# Patient Record
Sex: Male | Born: 1975 | Race: Black or African American | Hispanic: No | Marital: Married
Health system: Midwestern US, Community
[De-identification: ages and names within clinical notes are randomized; demographics above are authoritative.]

## PROBLEM LIST (undated history)

## (undated) ENCOUNTER — Inpatient Hospital Stay: Discharge: 2024-02-02 | Payer: Medicaid (Managed Care)

## (undated) DIAGNOSIS — E119 Type 2 diabetes mellitus without complications: Secondary | ICD-10-CM

## (undated) DIAGNOSIS — R112 Nausea with vomiting, unspecified: Principal | ICD-10-CM

---

## 2010-01-14 LAB — METABOLIC PANEL, BASIC
Anion gap: 1 mmol/L — ABNORMAL LOW (ref 5–15)
BUN/Creatinine ratio: 10 — ABNORMAL LOW (ref 12–20)
BUN: 10 MG/DL (ref 7–18)
CO2: 30 MMOL/L (ref 21–32)
Calcium: 8.1 MG/DL — ABNORMAL LOW (ref 8.4–10.4)
Chloride: 102 MMOL/L (ref 100–108)
Creatinine: 1 MG/DL (ref 0.6–1.3)
GFR est AA: >60 mL/min/{1.73_m2} (ref >60)
GFR est non-AA: >60 mL/min/{1.73_m2} (ref >60)
Glucose: 220 MG/DL — ABNORMAL HIGH (ref 74–99)
Potassium: 3.8 MMOL/L (ref 3.5–5.5)
Sodium: 133 MMOL/L — ABNORMAL LOW (ref 136–145)

## 2010-01-14 LAB — METABOLIC PANEL, COMPREHENSIVE
A-G Ratio: 1 (ref 0.8–1.7)
ALT (SGPT): 65 U/L (ref 30–65)
AST (SGOT): 104 U/L — ABNORMAL HIGH (ref 15–37)
Albumin: 3.6 g/dL (ref 3.4–5.0)
Alk. phosphatase: 87 U/L (ref 50–136)
Anion gap: 1 mmol/L — ABNORMAL LOW (ref 5–15)
BUN/Creatinine ratio: 10 — ABNORMAL LOW (ref 12–20)
BUN: 12 MG/DL (ref 7–18)
Bilirubin, total: 1 MG/DL (ref 0.2–1.0)
CO2: 31 MMOL/L (ref 21–32)
Calcium: 8.9 MG/DL (ref 8.4–10.4)
Chloride: 101 MMOL/L (ref 100–108)
Creatinine: 1.2 MG/DL (ref 0.6–1.3)
GFR est AA: >60 mL/min/{1.73_m2} (ref >60)
GFR est non-AA: >60 mL/min/{1.73_m2} (ref >60)
Globulin: 3.5 g/dL (ref 2.0–4.0)
Glucose: 346 MG/DL — ABNORMAL HIGH (ref 74–99)
Potassium: 4.3 MMOL/L (ref 3.5–5.5)
Protein, total: 7.1 g/dL (ref 6.4–8.2)
Sodium: 133 MMOL/L — ABNORMAL LOW (ref 136–145)

## 2010-01-14 LAB — CBC WITH AUTOMATED DIFF
ABS. BASOPHILS: 0 10*3/uL (ref 0.0–0.06)
ABS. EOSINOPHILS: 0.1 10*3/uL (ref 0.0–0.4)
ABS. LYMPHOCYTES: 1.9 10*3/uL (ref 0.9–3.6)
ABS. MONOCYTES: 0.3 10*3/uL (ref 0.05–1.2)
ABS. NEUTROPHILS: 3.2 10*3/uL (ref 1.8–8.0)
BASOPHILS: 0 % (ref 0–2)
EOSINOPHILS: 1 % (ref 0–5)
HCT: 42.3 % (ref 36.0–48.0)
HGB: 14.4 g/dL (ref 13.0–16.0)
LYMPHOCYTES: 34 % (ref 21–52)
MCH: 29.1 PG (ref 24.0–34.0)
MCHC: 34 g/dL (ref 31.0–37.0)
MCV: 85.6 FL (ref 74.0–97.0)
MONOCYTES: 6 % (ref 3–10)
MPV: 11.3 FL (ref 9.2–11.8)
NEUTROPHILS: 59 % (ref 40–73)
PLATELET: 166 10*3/uL (ref 135–420)
RBC: 4.94 M/uL (ref 4.70–5.50)
RDW: 12.5 % (ref 11.6–14.5)
WBC: 5.5 10*3/uL (ref 4.6–13.2)

## 2010-01-14 LAB — CARDIAC PANEL,(CK, CKMB & TROPONIN)
CK - MB: 3.6 ng/ml (ref 0.5–3.6)
CK-MB Index: 0.1 % (ref 0.0–4.0)
CK: 4229 U/L — ABNORMAL HIGH (ref 39–308)
Troponin-I, QT: <0.04 NG/ML (ref 0.00–0.05)

## 2010-01-14 LAB — GLUCOSE, POC: Glucose (POC): 256 mg/dL — ABNORMAL HIGH (ref 70–110)

## 2010-01-14 LAB — CK: CK: 3328 U/L — ABNORMAL HIGH (ref 39–308)

## 2010-01-18 LAB — GLUCOSE, POC: Glucose (POC): 416 mg/dL — CR (ref 70–110)

## 2010-06-18 LAB — SED RATE (ESR): Sed rate (ESR): 5 MM/HR (ref 0–15)

## 2010-06-18 LAB — CBC WITH AUTOMATED DIFF
ABS. BASOPHILS: 0 10*3/uL (ref 0.0–0.06)
ABS. EOSINOPHILS: 0 10*3/uL (ref 0.0–0.4)
ABS. LYMPHOCYTES: 1.5 10*3/uL (ref 0.9–3.6)
ABS. MONOCYTES: 0.4 10*3/uL (ref 0.05–1.2)
ABS. NEUTROPHILS: 3.1 10*3/uL (ref 1.8–8.0)
BASOPHILS: 0 % (ref 0–2)
EOSINOPHILS: 1 % (ref 0–5)
HCT: 44.2 % (ref 36.0–48.0)
HGB: 15.6 g/dL (ref 13.0–16.0)
LYMPHOCYTES: 31 % (ref 21–52)
MCH: 29.6 PG (ref 24.0–34.0)
MCHC: 35.3 g/dL (ref 31.0–37.0)
MCV: 83.9 FL (ref 74.0–97.0)
MONOCYTES: 7 % (ref 3–10)
MPV: 10.8 FL (ref 9.2–11.8)
NEUTROPHILS: 61 % (ref 40–73)
PLATELET: 168 10*3/uL (ref 135–420)
RBC: 5.27 M/uL (ref 4.70–5.50)
RDW: 12.2 % (ref 11.6–14.5)
WBC: 5 10*3/uL (ref 4.6–13.2)

## 2010-06-18 LAB — METABOLIC PANEL, BASIC
Anion gap: 7 mmol/L (ref 5–15)
BUN/Creatinine ratio: 8 — ABNORMAL LOW (ref 12–20)
BUN: 11 MG/DL (ref 7–18)
CO2: 28 MMOL/L (ref 21–32)
Calcium: 8.9 MG/DL (ref 8.4–10.4)
Chloride: 101 MMOL/L (ref 100–108)
Creatinine: 1.3 MG/DL (ref 0.6–1.3)
GFR est AA: >60 mL/min/{1.73_m2} (ref >60)
GFR est non-AA: >60 mL/min/{1.73_m2} (ref >60)
Glucose: 249 MG/DL — ABNORMAL HIGH (ref 74–99)
Potassium: 4 MMOL/L (ref 3.5–5.5)
Sodium: 136 MMOL/L (ref 136–145)

## 2012-12-11 NOTE — ED Notes (Signed)
Triage: pt states that he found a mattress outside a few days ago and sprayed it down with bleach. Pt reports that he slept on it on Monday night. Pt complains of itching and what appears to be bug bites to LE. Pt states that his sister laid on the bed yesterday and also has bites.

## 2012-12-11 NOTE — ED Notes (Signed)
I have reviewed discharge instructions with the patient.  The patient verbalized understanding.  Patient armband removed and shredded.

## 2012-12-11 NOTE — ED Notes (Signed)
This rn to bedside with PA.

## 2012-12-11 NOTE — ED Provider Notes (Signed)
MLP ATTESTATION:  I was present in the immediate clinical area during the time of the patient evaluation and available to provide input.  I have reviewed the chart and agree with the documentation recorded by the MLP, including the assessment, treatment plan, and disposition. Any procedures performed, as noted, were under my direction and / or I was present for the key portions.  Howard Patton S. Dekayla Prestridge, MD

## 2012-12-11 NOTE — ED Provider Notes (Signed)
HPI Comments: 9:23 PM   37 y.o. male presents to the ED C/O multiple insect bites after sleeping on a used mattress. Pt denies any other Sx or complaints.     PMHx includes DM.   Pt reports NKDA.     (+) Smoking  (+) EtOH use   (-) Elicit drugs    Recorded by Orlean Bradford, ED Scribe, as dictated by Zebedee Iba, PA-C    Patient is a 37 y.o. male presenting with Insect Bite. The history is provided by the patient. No language interpreter was used.   Insect Bite  This is a new problem. The problem occurs constantly. The problem has not changed since onset.       Past Medical History   Diagnosis Date   ??? Diabetes         History reviewed. No pertinent past surgical history.      History reviewed. No pertinent family history.     History     Social History   ??? Marital Status: MARRIED     Spouse Name: N/A     Number of Children: N/A   ??? Years of Education: N/A     Occupational History   ??? Not on file.     Social History Main Topics   ??? Smoking status: Current Every Day Smoker -- 0.50 packs/day   ??? Smokeless tobacco: Not on file   ??? Alcohol Use: Yes   ??? Drug Use: Not on file   ??? Sexually Active: Not on file     Other Topics Concern   ??? Not on file     Social History Narrative   ??? No narrative on file                  ALLERGIES: Review of patient's allergies indicates no known allergies.      Review of Systems   Musculoskeletal: Negative for myalgias, back pain, joint swelling, arthralgias and gait problem.   Skin: Positive for rash. Negative for color change, pallor and wound.   All other systems reviewed and are negative.        Filed Vitals:    12/11/12 2040   BP: 133/70   Pulse: 72   Temp: 98 ??F (36.7 ??C)   Resp: 16   Height: 6\' 2"  (1.88 m)   Weight: 79.379 kg (175 lb)   SpO2: 98%            Physical Exam   Vital signs and nursing notes reviewed.    CONSTITUTIONAL: Alert. Well-appearing; well-nourished; in no apparent distress.  HEAD: Normocephalic; atraumatic.  SKIN: Normal for age and race; warm; dry; good turgor;  Few pruritic wheals on right lower leg and left thigh. No erythema or increased warmth. No petechiae or purpura   NEURO: A & O x3.   PSYCH:  Mood and affect appropriate.       RESULTS:    Labs Reviewed - No data to display  No results found for this or any previous visit (from the past 12 hour(s)).       MDM     Amount and/or Complexity of Data Reviewed:   Discussion of test results with the performing providers:  No   Obtain history from someone other than the patient:  No   Discuss the patient with another provider:  No    MEDICATIONS:     Medications - No data to display    Procedures    9:24 PM  Answered  patient's questions regarding treatment.     DISCHARGE NOTE:  Cole White's  results have been reviewed with him.  He has been counseled regarding his diagnosis, treatment, and plan.  He verbally conveys understanding and agreement of the signs, symptoms, diagnosis, treatment and prognosis and additionally agrees to follow up as discussed.  He also agrees with the care-plan and conveys that all of his questions have been answered.  I have also provided discharge instructions for him that include: educational information regarding their diagnosis and treatment, and list of reasons why they would want to return to the ED prior to their follow-up appointment, should his condition change.      CLINICAL IMPRESSION:    1. Bug bites        AFTER VISIT PLAN:    - Rx Prednisone (topical)  - Follow up with PCP  - Return to ED if symptoms worsen.    Recorded by Orlean Bradford, ED Scribe, as dictated by Zebedee Iba, PA-C

## 2012-12-13 NOTE — ED Notes (Signed)
I spoke with patient and when asked who he follows up with for his diabetes he stated he usually just goes to the Emergency Department.  I have talked to him about that not being the best course of care for his diabetes and he agrees.  I have informed him about the [facility], I have explained to him how the clinic works, where and when to be there and the address of the next visit.  Patient is going to go on 12/17/12, he knows to be there at 7:00am and I will continue to follow up with patient.

## 2014-09-05 ENCOUNTER — Inpatient Hospital Stay
Admit: 2014-09-05 | Discharge: 2014-09-05 | Disposition: A | Discharge status: Home / Self Care | Payer: Self-pay | Attending: Emergency Medicine

## 2014-09-05 DIAGNOSIS — A5903 Trichomonal cystitis and urethritis: Secondary | ICD-10-CM

## 2014-09-05 MED ORDER — AZITHROMYCIN 250 MG TAB
250 mg | ORAL | Status: DC
Start: 2014-09-05 — End: 2014-09-05

## 2014-09-05 MED ORDER — METRONIDAZOLE 250 MG TAB
250 mg | ORAL | Status: AC
Start: 2014-09-05 — End: 2014-09-05
  Administered 2014-09-05: 21:00:00 via ORAL

## 2014-09-05 MED ORDER — LIDOCAINE HCL 1 % (10 MG/ML) IJ SOLN
10 mg/mL (1 %) | INTRAMUSCULAR | Status: DC
Start: 2014-09-05 — End: 2014-09-05

## 2014-09-05 MED ORDER — ONDANSETRON 4 MG TAB, RAPID DISSOLVE
4 mg | ORAL | Status: AC
Start: 2014-09-05 — End: 2014-09-05
  Administered 2014-09-05: 21:00:00 via ORAL

## 2014-09-05 MED FILL — METRONIDAZOLE 250 MG TAB: 250 mg | ORAL | Qty: 8

## 2014-09-05 MED FILL — ONDANSETRON 4 MG TAB, RAPID DISSOLVE: 4 mg | ORAL | Qty: 1

## 2014-09-05 NOTE — ED Notes (Signed)
Pt does not want treatment for possible gonorrhea/chlamydia; given zofran and flagyl @ this time to tx known exposure to trichomoniasis. Pt verbalized would potentially need tx for other STDs should partner inform is positive.

## 2014-09-05 NOTE — ED Notes (Signed)
Pt states here to be treated for exposure to trichomonas;  Pt denies any symptoms, denies dysuria, denies penile discharge or pain

## 2014-09-05 NOTE — ED Notes (Signed)
Patient armband removed and shredded. I have reviewed discharge instructions with the patient.  The patient verbalized understanding.

## 2014-09-05 NOTE — ED Provider Notes (Signed)
HPI Comments:   4:38 PM   Cole White is a 39 y.o. male presents to the ED for STD treatment. Pt's girlfriend is now being treated in the ED following a positive STD test and pt would like to be treated for same. Pt is currently asymptomatic. Pt recently tested negative for both Gonorrhea and Chlamydia and has declined treatment for both pending results of sexual contacts testing. Pt denies any other Sx or complaints.    Written by Buelah Manis, ED Scribe, as dictated by Synthia Innocent, PA-C            The history is provided by the patient. No language interpreter was used.        Past Medical History:   Diagnosis Date   ??? Diabetes (HCC)        History reviewed. No pertinent past surgical history.      History reviewed. No pertinent family history.    History     Social History   ??? Marital Status: MARRIED     Spouse Name: N/A   ??? Number of Children: N/A   ??? Years of Education: N/A     Occupational History   ??? Not on file.     Social History Main Topics   ??? Smoking status: Current Every Day Smoker -- 0.50 packs/day   ??? Smokeless tobacco: Not on file   ??? Alcohol Use: Yes   ??? Drug Use: Not on file   ??? Sexual Activity: Not on file     Other Topics Concern   ??? Not on file     Social History Narrative           ALLERGIES: Review of patient's allergies indicates no known allergies.      Review of Systems   Constitutional: Negative for fever and fatigue.   HENT: Negative for rhinorrhea and sore throat.    Respiratory: Negative for cough and shortness of breath.    Cardiovascular: Negative for chest pain and palpitations.   Gastrointestinal: Negative for nausea, vomiting, abdominal pain and diarrhea.   Genitourinary: Negative for dysuria, urgency, decreased urine volume, discharge, difficulty urinating and penile pain.   Musculoskeletal: Negative for myalgias and arthralgias.   Skin: Negative for color change and rash.   Neurological: Negative for light-headedness and headaches.    All other systems reviewed and are negative.      Filed Vitals:    09/05/14 1650   BP: 156/87   Pulse: 85   Temp: 98 ??F (36.7 ??C)   Resp: 16   SpO2: 99%            Physical Exam   Constitutional: He is oriented to person, place, and time. He appears well-developed and well-nourished. No distress.   HENT:   Head: Normocephalic and atraumatic.   Eyes: Conjunctivae and EOM are normal. Pupils are equal, round, and reactive to light.   Neck: Normal range of motion. Neck supple.   Musculoskeletal: Normal range of motion.   Neurological: He is alert and oriented to person, place, and time.   Skin: Skin is warm and dry.   Psychiatric: He has a normal mood and affect. His behavior is normal.   Nursing note and vitals reviewed.     RESULTS    No orders to display       Labs Reviewed - No data to display    No results found for this or any previous visit (from the past 12 hour(s)).  MDM  Number of Diagnoses or Management Options  Trichomonal urethritis in male:     MEDICATIONS GIVEN    Medications   metroNIDAZOLE (FLAGYL) tablet 2,000 mg (not administered)   ondansetron (ZOFRAN ODT) tablet 4 mg (not administered)        Procedures  PROGRESS NOTE:  4:38 PM   Initial assessment performed.  Written by Buelah Manisaniel Hoock, ED Scribe, as dictated by Synthia InnocentBrannon Koa Palla, PA-C     DISCHARGE NOTE:   4:52 PM   Cole White's results have been reviewed with patient and/or family. Patient and/or family has been counseled regarding diagnosis, treatment, and plan.  Patient and/or family verbally conveys understanding and agreement of the signs, symptoms, diagnosis, treatment and prognosis and additionally agrees to follow up as discussed.  Patient and/or family also agrees with the care-plan and conveys that all of his/her questions have been answered.  I have also provided discharge instructions for the patient and/or family that include: educational information regarding their diagnosis and treatment, and list of reasons why they would want to  return to the ED prior to their follow-up appointment, should patient's condition change.     CLINICAL IMPRESSION    1. Trichomonal urethritis in male         AFTER VISIT PLAN    Follow-up Information     Follow up With Details Comments Contact Info    [facility] Schedule an appointment as soon as possible for a visit in 2 days for follow up with PCP [address]  [place], [place] [id number]  [place] [id number]  [phone]    [facility]  As needed, If symptoms worsen [address]  [place] [id number]  [phone]          Current Discharge Medication List      CONTINUE these medications which have NOT CHANGED    Details   metFORMIN (GLUCOPHAGE) 500 mg tablet Take 500 mg by mouth two (2) times daily (with meals).      glyBURIDE (DIABETA) 5 mg tablet Take 10 mg by mouth two (2) times a day.             This note is prepared by Buelah Manisaniel Hoock, acting as Synthia InnocentBrannon Camara Rosander, PA-C        American FinancialBrannon Sharion Grieves, PA-C: The scribe's documentation has been prepared under my direction and personally reviewed by me in its entirety. I confirm that the note above accurately reflects all work, treatment, procedures, and medical decision making performed by me.

## 2019-02-19 ENCOUNTER — Observation Stay: Payer: Self-pay

## 2019-02-19 ENCOUNTER — Other Ambulatory Visit: Payer: Self-pay

## 2019-02-19 ENCOUNTER — Emergency Department: Payer: Self-pay

## 2019-02-19 ENCOUNTER — Encounter: Payer: Self-pay | Admitting: Emergency Medicine

## 2019-02-19 ENCOUNTER — Inpatient Hospital Stay
Admission: EM | Admit: 2019-02-19 | Discharge: 2019-02-21 | DRG: 074 | Disposition: A | Discharge status: Home / Self Care | Payer: Self-pay | Attending: Internal Medicine | Admitting: Internal Medicine

## 2019-02-19 DIAGNOSIS — F419 Anxiety disorder, unspecified: Secondary | ICD-10-CM | POA: Diagnosis present

## 2019-02-19 DIAGNOSIS — Z833 Family history of diabetes mellitus: Secondary | ICD-10-CM

## 2019-02-19 DIAGNOSIS — R778 Other specified abnormalities of plasma proteins: Secondary | ICD-10-CM

## 2019-02-19 DIAGNOSIS — R079 Chest pain, unspecified: Secondary | ICD-10-CM

## 2019-02-19 DIAGNOSIS — I10 Essential (primary) hypertension: Secondary | ICD-10-CM | POA: Diagnosis present

## 2019-02-19 DIAGNOSIS — G540 Brachial plexus disorders: Principal | ICD-10-CM | POA: Diagnosis present

## 2019-02-19 DIAGNOSIS — R7989 Other specified abnormal findings of blood chemistry: Secondary | ICD-10-CM | POA: Diagnosis present

## 2019-02-19 DIAGNOSIS — F1910 Other psychoactive substance abuse, uncomplicated: Secondary | ICD-10-CM | POA: Diagnosis present

## 2019-02-19 DIAGNOSIS — Z20828 Contact with and (suspected) exposure to other viral communicable diseases: Secondary | ICD-10-CM | POA: Diagnosis present

## 2019-02-19 DIAGNOSIS — E1165 Type 2 diabetes mellitus with hyperglycemia: Secondary | ICD-10-CM | POA: Diagnosis present

## 2019-02-19 DIAGNOSIS — F17210 Nicotine dependence, cigarettes, uncomplicated: Secondary | ICD-10-CM | POA: Diagnosis present

## 2019-02-19 DIAGNOSIS — E278 Other specified disorders of adrenal gland: Secondary | ICD-10-CM | POA: Diagnosis present

## 2019-02-19 HISTORY — DX: Type 2 diabetes mellitus without complications: E11.9

## 2019-02-19 LAB — URINALYSIS, COMPLETE (UACMP) WITH MICROSCOPIC
Bacteria, UA: NONE SEEN
Bilirubin Urine: NEGATIVE
Glucose, UA: >=500 mg/dL — AB
Ketones, ur: NEGATIVE mg/dL
Leukocytes,Ua: NEGATIVE
Nitrite: NEGATIVE
Protein, ur: NEGATIVE mg/dL
Specific Gravity, Urine: 1.021 (ref 1.005–1.030)
Squamous Epithelial / HPF: NONE SEEN (ref 0–5)
pH: 6 (ref 5.0–8.0)

## 2019-02-19 LAB — COMPREHENSIVE METABOLIC PANEL
ALT: 19 U/L (ref 0–44)
AST: 17 U/L (ref 15–41)
Albumin: 3.6 g/dL (ref 3.5–5.0)
Alkaline Phosphatase: 73 U/L (ref 38–126)
Anion gap: 9 (ref 5–15)
BUN: 12 mg/dL (ref 6–20)
CO2: 24 mmol/L (ref 22–32)
Calcium: 8.8 mg/dL — ABNORMAL LOW (ref 8.9–10.3)
Chloride: 101 mmol/L (ref 98–111)
Creatinine, Ser: 0.78 mg/dL (ref 0.61–1.24)
GFR calc Af Amer: >60 mL/min (ref >60)
GFR calc non Af Amer: >60 mL/min (ref >60)
Glucose, Bld: 290 mg/dL — ABNORMAL HIGH (ref 70–99)
Potassium: 4 mmol/L (ref 3.5–5.1)
Sodium: 134 mmol/L — ABNORMAL LOW (ref 135–145)
Total Bilirubin: 1.1 mg/dL (ref 0.3–1.2)
Total Protein: 7.2 g/dL (ref 6.5–8.1)

## 2019-02-19 LAB — TROPONIN I (HIGH SENSITIVITY)
Troponin I (High Sensitivity): 41 ng/L — ABNORMAL HIGH (ref <18)
Troponin I (High Sensitivity): 11 ng/L (ref <18)

## 2019-02-19 LAB — URINE DRUG SCREEN, QUALITATIVE (ARMC ONLY)
Amphetamines, Ur Screen: NOT DETECTED
Barbiturates, Ur Screen: NOT DETECTED
Benzodiazepine, Ur Scrn: NOT DETECTED
Cannabinoid 50 Ng, Ur ~~LOC~~: POSITIVE — AB
Cocaine Metabolite,Ur ~~LOC~~: NOT DETECTED
MDMA (Ecstasy)Ur Screen: NOT DETECTED
Methadone Scn, Ur: NOT DETECTED
Opiate, Ur Screen: POSITIVE — AB
Phencyclidine (PCP) Ur S: NOT DETECTED
Tricyclic, Ur Screen: NOT DETECTED

## 2019-02-19 LAB — CBC
HCT: 40.9 % (ref 39.0–52.0)
Hemoglobin: 13.8 g/dL (ref 13.0–17.0)
MCH: 28.7 pg (ref 26.0–34.0)
MCHC: 33.7 g/dL (ref 30.0–36.0)
MCV: 85 fL (ref 80.0–100.0)
Platelets: 247 10*3/uL (ref 150–400)
RBC: 4.81 MIL/uL (ref 4.22–5.81)
RDW: 11.8 % (ref 11.5–15.5)
WBC: 6.7 10*3/uL (ref 4.0–10.5)
nRBC: 0 % (ref 0.0–0.2)

## 2019-02-19 LAB — SARS CORONAVIRUS 2 BY RT PCR (HOSPITAL ORDER, PERFORMED IN ~~LOC~~ HOSPITAL LAB): SARS Coronavirus 2: NEGATIVE

## 2019-02-19 LAB — GLUCOSE, CAPILLARY: Glucose-Capillary: 169 mg/dL — ABNORMAL HIGH (ref 70–99)

## 2019-02-19 MED ORDER — GABAPENTIN 600 MG PO TABS
300.0000 mg | ORAL_TABLET | Freq: Three times a day (TID) | ORAL | Status: DC
  Administered 2019-02-19 – 2019-02-21 (×5): 300 mg via ORAL
  Filled 2019-02-19 (×5): qty 1

## 2019-02-19 MED ORDER — MORPHINE SULFATE (PF) 4 MG/ML IV SOLN
4.0000 mg | Freq: Once | INTRAVENOUS | Status: AC
  Administered 2019-02-19: 4 mg via INTRAVENOUS
  Filled 2019-02-19: qty 1

## 2019-02-19 MED ORDER — ONDANSETRON HCL 4 MG/2ML IJ SOLN
4.0000 mg | Freq: Once | INTRAMUSCULAR | Status: AC
  Administered 2019-02-19: 4 mg via INTRAVENOUS
  Filled 2019-02-19: qty 2

## 2019-02-19 MED ORDER — TRAMADOL HCL 50 MG PO TABS
50.0000 mg | ORAL_TABLET | Freq: Four times a day (QID) | ORAL | Status: DC | PRN
  Administered 2019-02-19: 50 mg via ORAL
  Filled 2019-02-19: qty 1

## 2019-02-19 MED ORDER — ENOXAPARIN SODIUM 40 MG/0.4ML ~~LOC~~ SOLN
40.0000 mg | SUBCUTANEOUS | Status: DC
  Administered 2019-02-19 – 2019-02-20 (×2): 40 mg via SUBCUTANEOUS
  Filled 2019-02-19 (×2): qty 0.4

## 2019-02-19 MED ORDER — NITROGLYCERIN 0.4 MG SL SUBL
0.4000 mg | SUBLINGUAL_TABLET | SUBLINGUAL | Status: DC | PRN
  Administered 2019-02-19: 0.4 mg via SUBLINGUAL
  Filled 2019-02-19: qty 1

## 2019-02-19 MED ORDER — LABETALOL HCL 5 MG/ML IV SOLN
10.0000 mg | INTRAVENOUS | Status: DC | PRN
  Administered 2019-02-20: 10 mg via INTRAVENOUS
  Filled 2019-02-19: qty 4

## 2019-02-19 MED ORDER — ACETAMINOPHEN 325 MG PO TABS: 650.0000 mg | ORAL_TABLET | ORAL | Status: DC | PRN

## 2019-02-19 MED ORDER — OXYCODONE HCL 5 MG PO TABS
5.0000 mg | ORAL_TABLET | ORAL | Status: DC | PRN
  Administered 2019-02-19 – 2019-02-20 (×4): 5 mg via ORAL
  Filled 2019-02-19 (×4): qty 1

## 2019-02-19 MED ORDER — INSULIN GLARGINE 100 UNIT/ML ~~LOC~~ SOLN
10.0000 [IU] | Freq: Every day | SUBCUTANEOUS | Status: DC
  Administered 2019-02-19 – 2019-02-20 (×2): 10 [IU] via SUBCUTANEOUS
  Filled 2019-02-19 (×3): qty 0.1

## 2019-02-19 MED ORDER — ASPIRIN 81 MG PO CHEW
324.0000 mg | CHEWABLE_TABLET | Freq: Once | ORAL | Status: AC
  Administered 2019-02-19: 324 mg via ORAL
  Filled 2019-02-19: qty 4

## 2019-02-19 MED ORDER — INSULIN ASPART 100 UNIT/ML ~~LOC~~ SOLN: 0.0000 [IU] | Freq: Every day | SUBCUTANEOUS | Status: DC

## 2019-02-19 MED ORDER — IOHEXOL 350 MG/ML SOLN
75.0000 mL | Freq: Once | INTRAVENOUS | Status: AC | PRN
  Administered 2019-02-19: 75 mL via INTRAVENOUS
  Filled 2019-02-19: qty 75

## 2019-02-19 MED ORDER — INSULIN ASPART 100 UNIT/ML ~~LOC~~ SOLN
0.0000 [IU] | Freq: Three times a day (TID) | SUBCUTANEOUS | Status: DC
  Administered 2019-02-20: 1 [IU] via SUBCUTANEOUS
  Administered 2019-02-20 – 2019-02-21 (×2): 2 [IU] via SUBCUTANEOUS
  Filled 2019-02-19 (×3): qty 1

## 2019-02-19 MED ORDER — IOHEXOL 9 MG/ML PO SOLN
500.0000 mL | ORAL | Status: AC
  Administered 2019-02-19: 500 mL via ORAL

## 2019-02-19 MED ORDER — ONDANSETRON HCL 4 MG/2ML IJ SOLN: 4.0000 mg | Freq: Four times a day (QID) | INTRAMUSCULAR | Status: DC | PRN

## 2019-02-19 NOTE — ED Provider Notes (Signed)
[facility] Emergency Department Provider Note  ____________________________________________   First MD Initiated Contact with Patient 02/19/19 1335     (approximate)  I have reviewed the triage vital signs and the nursing notes.   HISTORY  Chief Complaint Arm Pain    HPI Kirk Edwards is a 43 y.o. male presents emergency department with left arm pain.  States that he had left upper back pain earlier this morning.  States arm has continued to hurt.  He states that he took a Percocet 1 hour prior to arrival without any relief.  Patient does have history of diabetes.  He is a smoker.  He denies shortness of breath.  He states he is not had typical chest pain.    Past Medical History:  Diagnosis Date  . Diabetes mellitus without complication ([place])     There are no active problems to display for this patient.   History reviewed. No pertinent surgical history.  Prior to Admission medications   Not on File    Allergies Patient has no known allergies.  No family history on file.  Social History Social History   Tobacco Use  . Smoking status: Current Every Day Smoker    Packs/day: 0.50    Types: Cigarettes  . Smokeless tobacco: Never Used  Substance Use Topics  . Alcohol use: Yes    Comment: occasional  . Drug use: Never    Review of Systems  Constitutional: No fever/chills Eyes: No visual changes. ENT: No sore throat. Respiratory: Denies cough Cardiovascular: Denies chest pain/shortness of breath Genitourinary: Negative for dysuria. Musculoskeletal: Negative for back pain.  Positive for left arm pain and left upper back pain Skin: Negative for rash.    ____________________________________________   PHYSICAL EXAM:  VITAL SIGNS: ED Triage Vitals  Enc Vitals Group     BP 02/19/19 1329 (!) 176/100     Pulse Rate 02/19/19 1329 (!) 115     Resp 02/19/19 1329 16     Temp 02/19/19 1329 99 F (37.2 C)     Temp Source 02/19/19  1329 Oral     SpO2 02/19/19 1329 99 %     Weight 02/19/19 1330 180 lb (81.6 kg)     Height 02/19/19 1330 6\' 2"  (1.88 m)     Head Circumference --      Peak Flow --      Pain Score 02/19/19 1329 10     Pain Loc --      Pain Edu? --      Excl. in [place]? --     Constitutional: Alert and oriented. Well appearing and in no acute distress. Eyes: Conjunctivae are normal.  Head: Atraumatic. Nose: No congestion/rhinnorhea. Mouth/Throat: Mucous membranes are moist.   Neck:  supple no lymphadenopathy noted Cardiovascular: Tachycardic, regular rhythm. Heart sounds are normal Respiratory: Normal respiratory effort.  No retractions, lungs c t a  Abd: soft nontender bs normal all 4 quad GU: deferred Musculoskeletal: FROM all extremities, warm and well perfused, patient is holding the left arm over his head due to pain.  The area is not tender to palpation. Neurologic:  Normal speech and language.  Skin:  Skin is warm, dry and intact. No rash noted. Psychiatric: Mood and affect are normal. Speech and behavior are normal.  ____________________________________________   LABS (all labs ordered are listed, but only abnormal results are displayed)  Labs Reviewed  COMPREHENSIVE METABOLIC PANEL - Abnormal; Notable for the following components:      Result  Value   Sodium 134 (*)    Glucose, Bld 290 (*)    Calcium 8.8 (*)    All other components within normal limits  TROPONIN I (HIGH SENSITIVITY) - Abnormal; Notable for the following components:   Troponin I (High Sensitivity) 41 (*)    All other components within normal limits  SARS CORONAVIRUS 2 (HOSPITAL ORDER, PERFORMED IN [facility])  CBC  URINALYSIS, COMPLETE (UACMP) WITH MICROSCOPIC  URINE DRUG SCREEN, QUALITATIVE ([facility] ONLY)  TROPONIN I (HIGH SENSITIVITY)   ____________________________________________   ____________________________________________  RADIOLOGY  Chest x-ray is normal   ____________________________________________   PROCEDURES  Procedure(s) performed: EKG shows sinus tachycardia   Procedures    ____________________________________________   INITIAL IMPRESSION / ASSESSMENT AND PLAN / ED COURSE  Pertinent labs & imaging results that were available during my care of the patient were reviewed by me and considered in my medical decision making (see chart for details).   Patient's 43 year old male presents emergency department with left arm pain left upper back pain.  Physical exam shows patient is tachycardic with elevated blood pressure.  Range of exam is unremarkable  Chest x-ray is normal EKG shows sinus tachycardia Troponin is elevated at 41 Comprehensive metabolic panel has increased glucose at 290 CBC is normal  Had the patient moved to [facility] due to the tachycardia and left arm pain.  Dr. Cyril Loosen is aware.   CTA of the chest is negative  Dr. Cyril Loosen to call the hospitalist to admit.  Kirk Edwards was evaluated in Emergency Department on 02/19/2019 for the symptoms described in the history of present illness. He was evaluated in the context of the global COVID-19 pandemic, which necessitated consideration that the patient might be at risk for infection with the SARS-CoV-2 virus that causes COVID-19. Institutional protocols and algorithms that pertain to the evaluation of patients at risk for COVID-19 are in a state of rapid change based on information released by regulatory bodies including the CDC and federal and state organizations. These policies and algorithms were followed during the patient's care in the ED.   As part of my medical decision making, I reviewed the following data within the electronic MEDICAL RECORD NUMBER Nursing notes reviewed and incorporated, Labs reviewed see above, EKG interpreted tachycardia, Old chart reviewed, Radiograph reviewed chest x-ray and CTA chest are normal, Discussed with admitting physician hospitalist, Evaluated  by EM attending Dr. Cyril Loosen, Notes from prior ED visits and [place] Controlled Substance Database  ____________________________________________   FINAL CLINICAL IMPRESSION(S) / ED DIAGNOSES  Final diagnoses:  Chest pain, unspecified type  Elevated troponin      NEW MEDICATIONS STARTED DURING THIS VISIT:  New Prescriptions   No medications on file     Note:  This document was prepared using Dragon voice recognition software and may include unintentional dictation errors.    Faythe Ghee, PA-C 02/19/19 1637    Phineas Semen, MD 02/20/19 [phone]

## 2019-02-19 NOTE — ED Notes (Signed)
EKG completed

## 2019-02-19 NOTE — ED Triage Notes (Signed)
Patient reports pain in left bicep since yesterday. Reports pain gets worse when he puts his arm down at his side or lays down flat. Patient denies any known injury. No swelling or deformity noted.

## 2019-02-19 NOTE — ED Provider Notes (Signed)
Patient evaluated independently by me.  Primary complaint of left shoulder pain with some pain in the back.  No shortness of breath or pleurisy.  Pain started this morning upon waking.  Is never had this before.  EKG overall reassuring, mild tachycardia.  Lab work significant for elevated troponin of 41.  Will send for CT angiography.  Will require admission.   Lavonia Drafts, MD 02/19/19 [phone]

## 2019-02-19 NOTE — H&P (Addendum)
[facility] - [place] at [facility]   PATIENT NAME: Kirk Edwards    MR#:  [id number]  DATE OF BIRTH:  1976-02-14  DATE OF ADMISSION:  02/19/2019  PRIMARY CARE PHYSICIAN: Patient, No Pcp Per   REQUESTING/REFERRING PHYSICIAN: Jene Every, MD  CHIEF COMPLAINT:   Chief Complaint  Patient presents with  . Arm Pain    HISTORY OF PRESENT ILLNESS:  Kirk Edwards  is a 43 y.o. male with a known history of type 2 diabetes who presented to the ED with left arm pain and some questionable left chest pain.  Patient states he woke up this morning and noticed severe pain starting in his left shoulder and radiating all the way down to his fingertips.  He also endorses some left-sided neck pain.  The arm pain feels like a "tight pain" and "feeling like his arm is going to explode" and "a pulsating pain".  Patient is unsure if he has some left-sided chest pain.  He states that his arm pain is better with holding his arm above his head.  No trauma to the left neck, shoulder, or arm.  He denies any shortness of breath, diaphoresis, or nausea.  His pain does not get worse with exertion.  In the ED, he was hypertensive with BP 176/100 and tachycardic up to 115.  Labs are significant for glucose 290, troponin 41 > 11.  Chest x-ray was negative.  CTA chest negative for PE, aneurysm, or dissection, but did show a left adrenal gland nodule with attenuation greater than expected for an adenoma.  Patient was given a dose of aspirin 324 mg daily.  Hospitalists were called for admission.  PAST MEDICAL HISTORY:   Past Medical History:  Diagnosis Date  . Diabetes mellitus without complication (HCC)     PAST SURGICAL HISTORY:  History reviewed. No pertinent surgical history.  SOCIAL HISTORY:   Social History   Tobacco Use  . Smoking status: Current Every Day Smoker    Packs/day: 0.50    Types: Cigarettes  . Smokeless tobacco: Never Used  Substance Use Topics  . Alcohol use: Yes   Comment: occasional    FAMILY HISTORY:  Mother-diabetes  DRUG ALLERGIES:  No Known Allergies  REVIEW OF SYSTEMS:   Review of Systems  Constitutional: Negative for chills and fever.  HENT: Negative for congestion and sore throat.   Eyes: Negative for blurred vision and double vision.  Respiratory: Negative for cough and shortness of breath.   Cardiovascular: Positive for chest pain. Negative for palpitations.  Gastrointestinal: Negative for nausea and vomiting.  Genitourinary: Negative for dysuria and urgency.  Musculoskeletal: Positive for joint pain and neck pain. Negative for back pain and falls.  Neurological: Negative for dizziness and headaches.  Psychiatric/Behavioral: Negative for depression. The patient is not nervous/anxious.     MEDICATIONS AT HOME:   Prior to Admission medications   Not on File      VITAL SIGNS:  Blood pressure (!) 149/87, pulse 85, temperature 99 F (37.2 C), temperature source Oral, resp. rate 16, height 6\' 2"  (1.88 m), weight 81.6 kg, SpO2 98 %.  PHYSICAL EXAMINATION:  Physical Exam  GENERAL:  43 y.o.-year-old patient lying in the bed with no acute distress.  EYES: Pupils equal, round, reactive to light and accommodation. No scleral icterus. Extraocular muscles intact.  HEENT: Head atraumatic, normocephalic. Oropharynx and nasopharynx clear.  NECK:  Supple, no jugular venous distention. No thyroid enlargement, + mild tenderness to palpation of the paraspinal muscles  of the C-spine.  Full range of motion. LUNGS: Normal breath sounds bilaterally, no wheezing, rales,rhonchi or crepitation. No use of accessory muscles of respiration.  CARDIOVASCULAR: RRR, S1, S2 normal. No murmurs, rubs, or gallops.  ABDOMEN: Soft, nontender, nondistended. Bowel sounds present. No organomegaly or mass.  EXTREMITIES: No pedal edema, cyanosis, or clubbing.  No tenderness to palpation of the left shoulder.  Patient is holding his arm above his head. NEUROLOGIC:  Cranial nerves II through XII are intact. Muscle strength 5/5 in all extremities. Sensation intact. Gait not checked.  PSYCHIATRIC: The patient is alert and oriented x 3.  SKIN: No obvious rash, lesion, or ulcer.   LABORATORY PANEL:   CBC Recent Labs  Lab 02/19/19 1402  WBC 6.7  HGB 13.8  HCT 40.9  PLT 247   ------------------------------------------------------------------------------------------------------------------  Chemistries  Recent Labs  Lab 02/19/19 1402  NA 134*  K 4.0  CL 101  CO2 24  GLUCOSE 290*  BUN 12  CREATININE 0.78  CALCIUM 8.8*  AST 17  ALT 19  ALKPHOS 73  BILITOT 1.1   ------------------------------------------------------------------------------------------------------------------  Cardiac Enzymes No results for input(s): TROPONINI in the last 168 hours. ------------------------------------------------------------------------------------------------------------------  RADIOLOGY:  Dg Chest Port 1 View  Result Date: 02/19/2019 CLINICAL DATA:  Left arm and upper back pain EXAM: PORTABLE CHEST 1 VIEW COMPARISON:  None. FINDINGS: The heart size and mediastinal contours are within normal limits. Both lungs are clear. The visualized skeletal structures are unremarkable. IMPRESSION: No acute abnormality of the lungs in AP portable projection. Electronically Signed   By: Eddie Candle M.D.   On: 02/19/2019 14:09   Ct Angio Chest Aorta W And/or Wo Contrast  Result Date: 02/19/2019 CLINICAL DATA:  Per physician note: Primary complaint of left shoulder pain with some pain in the back. No shortness of breath or pleurisy. Pain started this morning upon waking. Is never had this before. EKG overall reassuring, mild tachycardia. EXAM: CT ANGIOGRAPHY CHEST WITH CONTRAST TECHNIQUE: Multidetector CT imaging of the chest was performed using the standard protocol during bolus administration of intravenous contrast. Multiplanar CT image reconstructions and MIPs were  obtained to evaluate the vascular anatomy. CONTRAST:  25mL OMNIPAQUE IOHEXOL 350 MG/ML SOLN COMPARISON:  None. FINDINGS: Cardiovascular: Preferential opacification of the thoracic aorta. Motion somewhat limits evaluation of the aortic root and ascending aorta. No evidence of thoracic aortic aneurysm or dissection. Normal heart size. No pericardial effusion. Mediastinum/Nodes: No enlarged mediastinal, hilar, or axillary lymph nodes. Thyroid gland, trachea, and esophagus demonstrate no significant findings. Lungs/Pleura: Minimal linear opacities at the right lung base likely scarring or atelectasis. The left lung is clear. No pleural effusion or pneumothorax. No suspicious pulmonary masses. Upper Abdomen: Left adrenal gland nodule measuring 2.2 cm (series 6, image 105) with attenuation greater than expected for an adenoma. Upper abdomen is otherwise unremarkable. Musculoskeletal: No chest wall abnormality. No acute or significant osseous findings. Review of the MIP images confirms the above findings. IMPRESSION: 1. Motion somewhat limits evaluation of the aortic root and ascending aorta. No evidence of thoracic aortic aneurysm or dissection. 2. Left adrenal gland nodule measuring 2.2 cm with attenuation greater than expected for an adenoma. Further evaluation with adrenal protocol CT or MRI is recommended on a nonemergent basis. Electronically Signed   By: Audie Pinto M.D.   On: 02/19/2019 15:54      IMPRESSION AND PLAN:   Chest pain- do not feel that this is cardiac in etiology.  Troponin 41 > 11.  EKG without signs  of ischemia.  Seems to be more left arm radiculopathy. -CTA chest was negative for PE, aneurysm, or dissection -ECHO ordered -Nitroglycerin SL as needed -Check lipid panel and A1c -Cardiac monitoring  Left neck pain with left arm radiculopathy -MRI C-spine -Start gabapentin tid -Consider orthopedic surgery consult pending MRI results  Left adrenal gland nodule- incidental finding  on CTA chest, attenuation is greater than expected for an adenoma -CT adrenal protocol ordered for further evaluation  Hypertension- BP was initially high (likely due to pain), but has improved on its own -Patient takes a BP med, but is unsure what it is. His pharmacy is already closed. Pharmacy tech will call in the morning to figure out what it is. -IV labetalol  Type 2 diabetes- blood sugar elevated in the ED -Continue home lantus 10 units daily -SSI -Check A1c  Polysubstance abuse -UDS positive for marijuana and opiates  All the records are reviewed and case discussed with ED provider. Management plans discussed with the patient, family and they are in agreement.  CODE STATUS: Full  TOTAL TIME TAKING CARE OF THIS PATIENT: 45 minutes.    Jinny Blossom Mayo M.D on 02/19/2019 at 4:45 PM  Between 7am to 6pm - Pager [phone]  After 6pm go to [email or web address] - Social research officer, government  [facility] [place] [facility]  Office  [phone]  CC: Primary care physician; Patient, No Pcp Per   Note: This dictation was prepared with Dragon dictation along with smaller phrase technology. Any transcriptional errors that result from this process are unintentional.

## 2019-02-19 NOTE — ED Notes (Signed)
Pt c/o L shoulder through arm/hand pain. Denies specific injury. States feels like "all of the blood is running down my arm into my hand making it burst." Pt has arm raised above head. States this makes it feel better. Radial pulse 2+ and arm warm. Denies jaw or CP but does state upper back pain.

## 2019-02-20 ENCOUNTER — Observation Stay (HOSPITAL_COMMUNITY)
Admit: 2019-02-20 | Discharge: 2019-02-20 | Disposition: A | Discharge status: Home / Self Care | Payer: Self-pay | Attending: Internal Medicine | Admitting: Internal Medicine

## 2019-02-20 DIAGNOSIS — G540 Brachial plexus disorders: Secondary | ICD-10-CM | POA: Diagnosis present

## 2019-02-20 DIAGNOSIS — R079 Chest pain, unspecified: Secondary | ICD-10-CM

## 2019-02-20 LAB — CBC
HCT: 39.2 % (ref 39.0–52.0)
Hemoglobin: 13.5 g/dL (ref 13.0–17.0)
MCH: 28.9 pg (ref 26.0–34.0)
MCHC: 34.4 g/dL (ref 30.0–36.0)
MCV: 83.9 fL (ref 80.0–100.0)
Platelets: 231 10*3/uL (ref 150–400)
RBC: 4.67 MIL/uL (ref 4.22–5.81)
RDW: 11.6 % (ref 11.5–15.5)
WBC: 4.3 10*3/uL (ref 4.0–10.5)
nRBC: 0 % (ref 0.0–0.2)

## 2019-02-20 LAB — BASIC METABOLIC PANEL
Anion gap: 9 (ref 5–15)
BUN: 9 mg/dL (ref 6–20)
CO2: 26 mmol/L (ref 22–32)
Calcium: 8.7 mg/dL — ABNORMAL LOW (ref 8.9–10.3)
Chloride: 101 mmol/L (ref 98–111)
Creatinine, Ser: 0.72 mg/dL (ref 0.61–1.24)
GFR calc Af Amer: >60 mL/min (ref >60)
GFR calc non Af Amer: >60 mL/min (ref >60)
Glucose, Bld: 168 mg/dL — ABNORMAL HIGH (ref 70–99)
Potassium: 3.5 mmol/L (ref 3.5–5.1)
Sodium: 136 mmol/L (ref 135–145)

## 2019-02-20 LAB — ECHOCARDIOGRAM COMPLETE
Height: 74 in
Weight: 2398.4 oz

## 2019-02-20 LAB — GLUCOSE, CAPILLARY
Glucose-Capillary: 126 mg/dL — ABNORMAL HIGH (ref 70–99)
Glucose-Capillary: 189 mg/dL — ABNORMAL HIGH (ref 70–99)
Glucose-Capillary: 140 mg/dL — ABNORMAL HIGH (ref 70–99)
Glucose-Capillary: 152 mg/dL — ABNORMAL HIGH (ref 70–99)

## 2019-02-20 LAB — LIPID PANEL
Cholesterol: 172 mg/dL (ref 0–200)
HDL: 50 mg/dL (ref >40)
LDL Cholesterol: 109 mg/dL — ABNORMAL HIGH (ref 0–99)
Total CHOL/HDL Ratio: 3.4 RATIO
Triglycerides: 65 mg/dL (ref <150)
VLDL: 13 mg/dL (ref 0–40)

## 2019-02-20 LAB — TSH: TSH: 4.225 u[IU]/mL (ref 0.350–4.500)

## 2019-02-20 LAB — CORTISOL: Cortisol, Plasma: 12.9 ug/dL

## 2019-02-20 MED ORDER — AMLODIPINE BESYLATE 5 MG PO TABS
5.0000 mg | ORAL_TABLET | Freq: Every day | ORAL | Status: DC
  Administered 2019-02-20 – 2019-02-21 (×2): 5 mg via ORAL
  Filled 2019-02-20 (×2): qty 1

## 2019-02-20 MED ORDER — IBUPROFEN 400 MG PO TABS
600.0000 mg | ORAL_TABLET | Freq: Three times a day (TID) | ORAL | Status: DC
  Administered 2019-02-20 – 2019-02-21 (×3): 600 mg via ORAL
  Filled 2019-02-20 (×3): qty 2

## 2019-02-20 MED ORDER — CLONAZEPAM 0.5 MG PO TABS
0.5000 mg | ORAL_TABLET | Freq: Two times a day (BID) | ORAL | Status: DC
  Administered 2019-02-20 – 2019-02-21 (×3): 0.5 mg via ORAL
  Filled 2019-02-20 (×3): qty 1

## 2019-02-20 NOTE — Progress Notes (Addendum)
[facility] - [place] at [facility]   PATIENT NAME: Kirk Edwards    MR#:  [id number]  DATE OF BIRTH:  Apr 15, 1976  SUBJECTIVE:  CHIEF COMPLAINT:   Chief Complaint  Patient presents with   Arm Pain    REVIEW OF SYSTEMS:  Review of Systems  Constitutional: Negative for chills, fever and malaise/fatigue.  HENT: Negative for congestion, ear discharge, hearing loss and nosebleeds.   Eyes: Negative for blurred vision and double vision.  Respiratory: Negative for cough, shortness of breath and wheezing.   Cardiovascular: Negative for chest pain and palpitations.  Gastrointestinal: Negative for abdominal pain, constipation, diarrhea, nausea and vomiting.  Genitourinary: Negative for dysuria.  Musculoskeletal: Positive for myalgias.  Neurological: Negative for dizziness, focal weakness, seizures, weakness and headaches.  Psychiatric/Behavioral: Negative for depression.    DRUG ALLERGIES:  No Known Allergies  VITALS:  Blood pressure (!) 160/94, pulse 81, temperature 97.7 F (36.5 C), temperature source Oral, resp. rate 19, height 6\' 2"  (1.88 m), weight 68 kg, SpO2 100 %.  PHYSICAL EXAMINATION:  Physical Exam  GENERAL:  43 y.o.-year-old patient lying in the bed with no acute distress.  EYES: Pupils equal, round, reactive to light and accommodation. No scleral icterus. Extraocular muscles intact.  HEENT: Head atraumatic, normocephalic. Oropharynx and nasopharynx clear.  NECK:  Supple, no jugular venous distention. No thyroid enlargement, no tenderness.  LUNGS: Normal breath sounds bilaterally, no wheezing, rales,rhonchi or crepitation. No use of accessory muscles of respiration.  CARDIOVASCULAR: S1, S2 normal. No murmurs, rubs, or gallops.  ABDOMEN: Soft, nontender, nondistended. Bowel sounds present. No organomegaly or mass.  EXTREMITIES: No pedal edema, cyanosis, or clubbing.  NEUROLOGIC: Cranial nerves II through XII are intact. Muscle strength 5/5 in all  extremities. Sensation intact. Gait not checked.  PSYCHIATRIC: The patient is alert and oriented x 3.  SKIN: No obvious rash, lesion, or ulcer.    LABORATORY PANEL:   CBC Recent Labs  Lab 02/20/19 0427  WBC 4.3  HGB 13.5  HCT 39.2  PLT 231   ------------------------------------------------------------------------------------------------------------------  Chemistries  Recent Labs  Lab 02/19/19 1402 02/20/19 0427  NA 134* 136  K 4.0 3.5  CL 101 101  CO2 24 26  GLUCOSE 290* 168*  BUN 12 9  CREATININE 0.78 0.72  CALCIUM 8.8* 8.7*  AST 17  --   ALT 19  --   ALKPHOS 73  --   BILITOT 1.1  --    ------------------------------------------------------------------------------------------------------------------  Cardiac Enzymes No results for input(s): TROPONINI in the last 168 hours. ------------------------------------------------------------------------------------------------------------------  RADIOLOGY:  Mr Cervical Spine Wo Contrast  Result Date: 02/19/2019 CLINICAL DATA:  Left-sided neck pain and left upper extremity pain. Radiculopathy. EXAM: MRI CERVICAL SPINE WITHOUT CONTRAST TECHNIQUE: Multiplanar, multisequence MR imaging of the cervical spine was performed. No intravenous contrast was administered. COMPARISON:  None. FINDINGS: Alignment: Reversal of the lower cervical lordosis. Alignment is otherwise normal. Vertebrae: No fracture, evidence of discitis, or bone lesion. No significant facet arthritis. No foraminal or spinal stenosis. Cord: Normal signal and morphology. Posterior Fossa, vertebral arteries, paraspinal tissues: Negative. Disc levels: C2-3: Normal. C3-4: Normal. C4-5: Normal. C5-6: Slight disc space narrowing. Tiny disc bulge central and to the right without neural impingement. C6-7: Tiny broad-based disc bulge without neural impingement. C7-T1: Normal. IMPRESSION: No significant abnormality of the cervical spine. Electronically Signed   By: 02/21/2019  M.D.   On: 02/19/2019 20:28   Dg Chest Port 1 View  Result Date: 02/19/2019 CLINICAL DATA:  Left  arm and upper back pain EXAM: PORTABLE CHEST 1 VIEW COMPARISON:  None. FINDINGS: The heart size and mediastinal contours are within normal limits. Both lungs are clear. The visualized skeletal structures are unremarkable. IMPRESSION: No acute abnormality of the lungs in AP portable projection. Electronically Signed   By: Lauralyn PrimesAlex  Bibbey M.D.   On: 02/19/2019 14:09   Ct Angio Chest Aorta W And/or Wo Contrast  Result Date: 02/19/2019 CLINICAL DATA:  Per physician note: Primary complaint of left shoulder pain with some pain in the back. No shortness of breath or pleurisy. Pain started this morning upon waking. Is never had this before. EKG overall reassuring, mild tachycardia. EXAM: CT ANGIOGRAPHY CHEST WITH CONTRAST TECHNIQUE: Multidetector CT imaging of the chest was performed using the standard protocol during bolus administration of intravenous contrast. Multiplanar CT image reconstructions and MIPs were obtained to evaluate the vascular anatomy. CONTRAST:  75mL OMNIPAQUE IOHEXOL 350 MG/ML SOLN COMPARISON:  None. FINDINGS: Cardiovascular: Preferential opacification of the thoracic aorta. Motion somewhat limits evaluation of the aortic root and ascending aorta. No evidence of thoracic aortic aneurysm or dissection. Normal heart size. No pericardial effusion. Mediastinum/Nodes: No enlarged mediastinal, hilar, or axillary lymph nodes. Thyroid gland, trachea, and esophagus demonstrate no significant findings. Lungs/Pleura: Minimal linear opacities at the right lung base likely scarring or atelectasis. The left lung is clear. No pleural effusion or pneumothorax. No suspicious pulmonary masses. Upper Abdomen: Left adrenal gland nodule measuring 2.2 cm (series 6, image 105) with attenuation greater than expected for an adenoma. Upper abdomen is otherwise unremarkable. Musculoskeletal: No chest wall abnormality. No acute  or significant osseous findings. Review of the MIP images confirms the above findings. IMPRESSION: 1. Motion somewhat limits evaluation of the aortic root and ascending aorta. No evidence of thoracic aortic aneurysm or dissection. 2. Left adrenal gland nodule measuring 2.2 cm with attenuation greater than expected for an adenoma. Further evaluation with adrenal protocol CT or MRI is recommended on a nonemergent basis. Electronically Signed   By: Emmaline KluverNancy  Ballantyne M.D.   On: 02/19/2019 15:54   Ct Adrenal Abdomen Wo Contrast  Result Date: 02/19/2019 CLINICAL DATA:  Left adrenal nodule EXAM: CT ABDOMEN WITHOUT CONTRAST TECHNIQUE: Multidetector CT imaging of the abdomen was performed following the standard protocol without IV contrast. COMPARISON:  CTA chest earlier today FINDINGS: Lower chest: Lung bases are clear. No effusions. Heart is normal size. Hepatobiliary: No focal hepatic abnormality. Gallbladder unremarkable. Pancreas: No focal abnormality or ductal dilatation. Spleen: No focal abnormality.  Normal size. Adrenals/Urinary Tract: Fullness and probable nodule in the left adrenal gland measures approximately 14 x 12 mm. This has a density of approximately 42 Hounsfield units, nondiagnostic for adenoma. Right adrenal gland, kidneys have an unremarkable unenhanced appearance. No hydronephrosis. Stomach/Bowel: Stomach, large and small bowel unremarkable. Appendix is normal. Moderate stool throughout the colon. Vascular/Lymphatic: Scattered aortic calcifications. No evidence of aneurysm or adenopathy. Other: No free fluid or free air. Musculoskeletal: No acute bony abnormality. IMPRESSION: Small nodule in the left adrenal gland. Density is not diagnostic for adrenal adenoma. This warrants further evaluation with adrenal protocol MRI. Electronically Signed   By: Charlett NoseKevin  Dover M.D.   On: 02/19/2019 22:23    EKG:   Orders placed or performed during the hospital encounter of 02/19/19   ED EKG   ED EKG    EKG 12-Lead   EKG 12-Lead   EKG 12-Lead (recurrent chest pain)    ASSESSMENT AND PLAN:   43 year old male with recent diagnosis of type 2  diabetes mellitus on insulin presents to hospital secondary to worsening right arm pain.  1.  Right arm pain-radicular pain starting in the arm shooting down to his fingers. -MRI of the C-spine with no abnormality noted.  CT angiogram of the chest showing negative for any PE, aneurysm or dissection.  -Sounds like a brachial plexopathy syndrome -Started on Motrin.  If does not improve, will need EMG nerve conduction studies.  Will start steroids at the time. -PT and OT consults.  Continue gabapentin  2.  Anxiety-start low-dose Klonopin  3.  Hypertension-start on Norvasc  4.  Type 2 diabetes mellitus-A1c pending.  Continue Lantus and sliding scale insulin.  Metformin is on hold.  5.  DVT prophylaxis-Lovenox     All the records are reviewed and case discussed with Care Management/Social Workerr. Management plans discussed with the patient, family and they are in agreement.  CODE STATUS: Full code  TOTAL TIME TAKING CARE OF THIS PATIENT: 38 minutes.   POSSIBLE D/C IN 1-2 DAYS, DEPENDING ON CLINICAL CONDITION.   Gladstone Lighter M.D on 02/20/2019 at 1:31 PM  Between 7am to 6pm - Pager - [phone]  After 6pm go to [email or web address] - password [email or web address] [facility]  Office  [phone]  CC: Primary care physician; Patient, No Pcp Per

## 2019-02-20 NOTE — Progress Notes (Signed)
*  PRELIMINARY RESULTS* Echocardiogram 2D Echocardiogram has been performed.  Kirk Edwards 02/20/2019, 9:14 AM

## 2019-02-20 NOTE — TOC Initial Note (Signed)
Transition of Care [facility]) - Initial/Assessment Note    Patient Details  Name: Kirk Edwards MRN: [id number] Date of Birth: [birth year]  Transition of Care [facility]) CM/SW Contact:    Elza Rafter, RN Phone Number: 02/20/2019, 12:10 PM  Clinical Narrative:      RNCM to assess as patient does not have insurance.  PCP is with [facility] in [place]; he obtains medications there as well.    Independent in all adls, denies issues accessing medical care, obtaining medications or with transportation.  Current with PCP.  No discharge needs identified at present by care manager or members of care team.                Expected Discharge Plan: Home/Self Care Barriers to Discharge: Continued Medical Work up   Patient Goals and CMS Choice        Expected Discharge Plan and Services Expected Discharge Plan: Home/Self Care   Discharge Planning Services: CM Consult   Living arrangements for the past 2 months: Apartment                                      Prior Living Arrangements/Services Living arrangements for the past 2 months: Apartment Lives with:: Siblings Patient language and need for interpreter reviewed:: Yes              Criminal Activity/Legal Involvement Pertinent to Current Situation/Hospitalization: No - Comment as needed  Activities of Daily Living Home Assistive Devices/Equipment: None ADL Screening (condition at time of admission) Patient's cognitive ability adequate to safely complete daily activities?: Yes Is the patient deaf or have difficulty hearing?: No Does the patient have difficulty seeing, even when wearing glasses/contacts?: No Does the patient have difficulty concentrating, remembering, or making decisions?: No Patient able to express need for assistance with ADLs?: Yes Does the patient have difficulty dressing or bathing?: No Independently performs ADLs?: Yes (appropriate for developmental age) Does the patient have difficulty walking or  climbing stairs?: No Weakness of Legs: None Weakness of Arms/Hands: Left  Permission Sought/Granted                  Emotional Assessment Appearance:: Appears stated age Attitude/Demeanor/Rapport: Gracious Affect (typically observed): Accepting Orientation: : Oriented to Self, Oriented to Place, Oriented to  Time, Oriented to Situation Alcohol / Substance Use: Not Applicable Psych Involvement: No (comment)  Admission diagnosis:  Elevated troponin [R79.89] Chest pain, unspecified type [R07.9] Patient Active Problem List   Diagnosis Date Noted  . Chest pain 02/19/2019   PCP:  Patient, No Pcp Per Pharmacy:   [id number] [place], [address] [place] [address] Phone: [phone] Fax: [phone]     Social Determinants of Health (SDOH) Interventions    Readmission Risk Interventions No flowsheet data found.

## 2019-02-20 NOTE — Plan of Care (Signed)
  Problem: Education: Goal: Knowledge of General Education information will improve Description: Including pain rating scale, medication(s)/side effects and non-pharmacologic comfort measures Outcome: Progressing   Problem: Pain Managment: Goal: General experience of comfort will improve Outcome: Progressing   

## 2019-02-21 LAB — HEMOGLOBIN A1C
Hgb A1c MFr Bld: 12.1 % — ABNORMAL HIGH (ref 4.8–5.6)
Mean Plasma Glucose: 301 mg/dL

## 2019-02-21 LAB — GLUCOSE, CAPILLARY: Glucose-Capillary: 187 mg/dL — ABNORMAL HIGH (ref 70–99)

## 2019-02-21 LAB — HIV ANTIBODY (ROUTINE TESTING W REFLEX): HIV Screen 4th Generation wRfx: NONREACTIVE

## 2019-02-21 MED ORDER — PREDNISONE 20 MG PO TABS: 40.0000 mg | ORAL_TABLET | Freq: Every day | ORAL | 0 refills | Status: AC

## 2019-02-21 MED ORDER — IBUPROFEN 600 MG PO TABS: 600.0000 mg | ORAL_TABLET | Freq: Three times a day (TID) | ORAL | 0 refills | Status: AC

## 2019-02-21 MED ORDER — GABAPENTIN 300 MG PO CAPS: 300.0000 mg | ORAL_CAPSULE | Freq: Three times a day (TID) | ORAL | 0 refills | Status: AC

## 2019-02-21 NOTE — Evaluation (Signed)
Occupational Therapy Evaluation Patient Details Name: Kirk Edwards MRN: [id number] DOB: 1975-11-26 Today's Date: 02/21/2019    History of Present Illness From MD H&P: "Kirk Edwards  is a 43 y.o. male with a known history of type 2 diabetes who presented to the ED with left arm pain and some questionable left chest pain.  Patient states he woke up this morning (02/19/19) and noticed severe pain starting in his left shoulder and radiating all the way down to his fingertips.  He also endorses some left-sided neck pain.  The arm pain feels like a "tight pain" and "feeling like his arm is going to explode" and "a pulsating pain".  Patient is unsure if he has some left-sided chest pain.  He states that his arm pain is better with holding his arm above his head.  No trauma to the left neck, shoulder, or arm.  He denies any shortness of breath, diaphoresis, or nausea.  His pain does not get worse with exertion."   Clinical Impression   Kirk Edwards was seen for OT evaluation this date. Pt received supine in bed with bed alarm off. Pt endorses being active and independent prior to hospital admission. He states he lives in a 1-level apartment home with his sister. Pt does not use an adapted device for functional mobility and denies having any falls history in the past 6 months. Pt presents with significant pain (5/10 at rest) in his LUE which he reports radiates from his shoulder down to his hand. Otherwise, no strength, sensory, or motor deficits were appreciated during this OT assessment. The pt was able to complete upper and lower body dressing with no assist needed. He reports ambulating ad-lib in his room to use the bathroom, and denies and functional UE impairment this date. During OT evaluation, pt completed functional mobility independently without an adapted device. Per pt, the pain in his arm increases as the arm hangs to his side and decreases if he supports his LUE with his RUE or holds his arm overhead. This  Chartered loss adjuster spoke with Dr. Nemiah Commander (attending physician) about potentially obtaining a shoulder-arm sling to improve pt comfort and minimize pain. Pt educated on positioning strategies to minimize weight bearing through his LUE as well as complementary alternative methods for pain management including distraction and relaxation techniques to improve pt overall comfort and activity tolerance. Pt verbalized understanding of all education provided. No further skilled acute OT needs identified. Will DC in house. Please re-consult if additional OT needs arise during this admission. Upon hospital DC, recommend pt pursue outpatient occupational therapy if pain continues and limits functional independence in ADL or IADL mgt.      Follow Up Recommendations  Outpatient OT If pain progresses and limits functional independence during daily routines/meaningful occupations of daily life.     Equipment Recommendations  None recommended by OT    Recommendations for Other Services       Precautions / Restrictions Precautions Precautions: Fall Precaution Comments: low fall      Mobility Bed Mobility Overal bed mobility: Independent             General bed mobility comments: Pt comes to sitting from supine with ease. No increased time/effort this date.  Transfers Overall transfer level: Independent               General transfer comment: Pt completed multiple STS during functional tasks without need for AD. Mild LOB during ambulation around nurses station, however, pt self corrected and remained stable  for remainder of OT evaluation.    Balance Overall balance assessment: Mild deficits observed, not formally tested(Pt ambulates without device this date. Initially demonstrated slight LOB during ambulation around nurses station and c/o mild dizziness in standing which resolved by end of session.)                                         ADL either performed or assessed with  clinical judgement   ADL Overall ADL's : At baseline                                       General ADL Comments: Pt limited by pain, but otherwise appears at baseline level to complete ADLs this date. Per pt and primary RN pt has been up ad lib in room to use the bathroom independenty. He is able to dress himself from sit to stand independently despite increase in pain with use of the LUE. Pt completes functional mobility without an AD. Minimal LOB noted. Pt endorses mild dizziness with standing which resolves after short rest break.     Vision Baseline Vision/History: Wears glasses Wears Glasses: At all times Patient Visual Report: No change from baseline       Perception     Praxis      Pertinent Vitals/Pain Pain Assessment: 0-10 Pain Score: 5  Pain Location: L arm/shoulder Pain Descriptors / Indicators: Constant;Discomfort;Radiating;Tightness Pain Intervention(s): Limited activity within patient's tolerance;Monitored during session;Repositioned;Utilized relaxation techniques     Hand Dominance Right   Extremity/Trunk Assessment Upper Extremity Assessment Upper Extremity Assessment: LUE deficits/detail LUE Deficits / Details: Pt limited by constant pain which he describes subjectively as "shooting, tightness, that radiates down" from his shoulder to his arm and hand. Pt grip strength, FMC, and ROM WFL. MMT of arm/shoulder deferred 2/2 pt pain. Pt does not appear to have strength, sensory, or motor deficits that impact function. Uses LUE and RUE to don street clothes during assessment with no assist needed from therapist.   Lower Extremity Assessment Lower Extremity Assessment: Overall WFL for tasks assessed   Cervical / Trunk Assessment Cervical / Trunk Assessment: Normal   Communication Communication Communication: No difficulties   Cognition Arousal/Alertness: Awake/alert Behavior During Therapy: WFL for tasks assessed/performed Overall Cognitive  Status: Within Functional Limits for tasks assessed                                 General Comments: Pt pleasent, agreeable to OT evaluation. following multi-step VC's consistently t/o assessment.   General Comments       Exercises Other Exercises Other Exercises: Pt educated in complementary alternative methods for pain management this date including relaxation and distraction techniuqes as well as use of pursed lip breathing during functional activity. Pt return verbalized understanding of education provided. Other Exercises: Pt and provider discussed routines modifications to address LUE pain and minimize weight bearing through the LUE at times of increased pain including sitting during LB dressing and positioning strategies for LUE during rest and sleep.   Shoulder Instructions      Home Living Family/patient expects to be discharged to:: Private residence Living Arrangements: Other relatives(Sister) Available Help at Discharge: Family;Available PRN/intermittently Type of Home: Apartment Home Access: Level entry  Home Layout: One level     Bathroom Shower/Tub: IT trainerTub/shower unit;Curtain   Bathroom Toilet: Standard Bathroom Accessibility: Yes How Accessible: Accessible via walker Home Equipment: None          Prior Functioning/Environment Level of Independence: Independent        Comments: Pt endorses total independence with ADL/IADL mgt. Driving, community ambulator.        OT Problem List: Pain;Decreased activity tolerance;Decreased safety awareness;Decreased knowledge of use of DME or AE      OT Treatment/Interventions:      OT Goals(Current goals can be found in the care plan section) Acute Rehab OT Goals Patient Stated Goal: To have less pain OT Goal Formulation: With patient Time For Goal Achievement: 03/07/19 Potential to Achieve Goals: Good  OT Frequency:     Barriers to D/C:            Co-evaluation              AM-PAC  OT "6 Clicks" Daily Activity     Outcome Measure Help from another person eating meals?: None Help from another person taking care of personal grooming?: None Help from another person toileting, which includes using toliet, bedpan, or urinal?: None Help from another person bathing (including washing, rinsing, drying)?: None Help from another person to put on and taking off regular upper body clothing?: None Help from another person to put on and taking off regular lower body clothing?: None 6 Click Score: 24   End of Session Equipment Utilized During Treatment: Gait belt Nurse Communication: Mobility status;Other (comment)  Activity Tolerance: Patient tolerated treatment well Patient left: in bed;with call bell/phone within reach  OT Visit Diagnosis: Pain Pain - Right/Left: Left Pain - part of body: Shoulder;Arm;Hand                Time: [id number] OT Time Calculation (min): 18 min Charges:  OT General Charges $OT Visit: 1 Visit OT Evaluation $OT Eval Low Complexity: 1 Low OT Treatments $Self Care/Home Management : 8-22 mins  Rockney GheeSerenity Beyonce Sawatzky, M.S., OTR/L Ascom: [phone] 02/21/19, 10:12 AM

## 2019-02-21 NOTE — TOC Transition Note (Signed)
Transition of Care [facility]) - CM/SW Discharge Note   Patient Details  Name: Kirk Edwards MRN: [id number] Date of Birth: [birth year]  [facility] of [facility]) CM/SW Contact:  Elza Rafter, RN Phone Number: 02/21/2019, 10:39 AM   Clinical Narrative:   Patient discharging to home today.  Made hospital follow up appointment on Oct. 8th at 2pm with Dr. Jeanell Sparrow.  Will need outpatient OT referral from the [facility] PCP.     Final next level of care: Home/Self Care Barriers to Discharge: No Barriers Identified   Patient Goals and CMS Choice        Discharge Placement                       Discharge Plan and Services   Discharge Planning Services: CM Consult                                 Social Determinants of Health (SDOH) Interventions     Readmission Risk Interventions No flowsheet data found.

## 2019-02-21 NOTE — Discharge Summary (Signed)
[employer] [facility] [place] at [facility]   PATIENT NAME: Kirk Edwards    MR#:  [id number]  DATE OF BIRTH:  08/25/1975  DATE OF ADMISSION:  02/19/2019   ADMITTING PHYSICIAN: Campbell Stall, MD  DATE OF DISCHARGE: 02/21/2019 12:20 PM  PRIMARY CARE PHYSICIAN: Patient, No Pcp Per   ADMISSION DIAGNOSIS:   Elevated troponin [R79.89] Chest pain, unspecified type [R07.9]  DISCHARGE DIAGNOSIS:   Active Problems:   Chest pain   Brachial plexopathy   SECONDARY DIAGNOSIS:   Past Medical History:  Diagnosis Date  . Diabetes mellitus without complication [facility])     HOSPITAL COURSE:   43 year old male with recent diagnosis of type 2 diabetes mellitus on insulin presents to hospital secondary to worsening right arm pain.  1.  Right arm pain-radicular pain starting in the arm shooting down to his fingers. -MRI of the C-spine with no abnormality noted.  CT angiogram of the chest showing negative for any PE, aneurysm or dissection.  -Likely secondary to above a brachial plexopathy syndrome (brachial plexitis) -Started on Motrin with some improvement.  Also added prednisone for anti-inflammatory effect at discharge. -.  If does not improve, will need EMG nerve conduction studies.    Outpatient follow-up with neurology recommended. -Appreciate OT consult.  Will give a sling for resting his arm as it improves with lifting the arm.. - Continue gabapentin for neuropathy pain  2.  Hypertension-started on Norvasc  3.  Type 2 diabetes mellitus-A1c 12.1.  -  Continue Levemir and januvia.  Up and ambulatory.  Some improvement in symptoms noted today.  Will be discharged home today    DISCHARGE CONDITIONS:   Guarded  CONSULTS OBTAINED:   None  DRUG ALLERGIES:   No Known Allergies DISCHARGE MEDICATIONS:   Allergies as of 02/21/2019   No Known Allergies     Medication List    TAKE these medications   amLODipine 5 MG tablet Commonly known as: NORVASC Take 5  mg by mouth daily.   gabapentin 300 MG capsule Commonly known as: Neurontin Take 1 capsule (300 mg total) by mouth 3 (three) times daily.   ibuprofen 600 MG tablet Commonly known as: ADVIL Take 1 tablet (600 mg total) by mouth 3 (three) times daily for 10 days.   insulin detemir 100 UNIT/ML injection Commonly known as: LEVEMIR Inject 10 Units into the skin daily.   predniSONE 20 MG tablet Commonly known as: Deltasone Take 2 tablets (40 mg total) by mouth daily for 7 days.   sitaGLIPtin 25 MG tablet Commonly known as: JANUVIA Take 25 mg by mouth daily.        DISCHARGE INSTRUCTIONS:   1. PCP f/u in 1-2 weeks 2. Neurology f/u in 1-2 weeks  DIET:   Cardiac diet and diabetic diet  ACTIVITY:   Activity as tolerated  OXYGEN:   Home Oxygen: No.  Oxygen Delivery: room air  DISCHARGE LOCATION:   home   If you experience worsening of your admission symptoms, develop shortness of breath, life threatening emergency, suicidal or homicidal thoughts you must seek medical attention immediately by calling 911 or calling your MD immediately  if symptoms less severe.  You Must read complete instructions/literature along with all the possible adverse reactions/side effects for all the Medicines you take and that have been prescribed to you. Take any new Medicines after you have completely understood and accpet all the possible adverse reactions/side effects.   Please note  You were cared for by a hospitalist  during your hospital stay. If you have any questions about your discharge medications or the care you received while you were in the hospital after you are discharged, you can call the unit and asked to speak with the hospitalist on call if the hospitalist that took care of you is not available. Once you are discharged, your primary care physician will handle any further medical issues. Please note that NO REFILLS for any discharge medications will be authorized once you are  discharged, as it is imperative that you return to your primary care physician (or establish a relationship with a primary care physician if you do not have one) for your aftercare needs so that they can reassess your need for medications and monitor your lab values.    On the day of Discharge:  VITAL SIGNS:   Blood pressure (!) 146/101, pulse 99, temperature 98.3 F (36.8 C), temperature source Oral, resp. rate 18, height 6\' 2"  (1.88 m), weight 68 kg, SpO2 100 %.  PHYSICAL EXAMINATION:    GENERAL:  43 y.o.-year-old patient lying in the bed with no acute distress.  EYES: Pupils equal, round, reactive to light and accommodation. No scleral icterus. Extraocular muscles intact.  HEENT: Head atraumatic, normocephalic. Oropharynx and nasopharynx clear.  NECK:  Supple, no jugular venous distention. No thyroid enlargement, no tenderness.  LUNGS: Normal breath sounds bilaterally, no wheezing, rales,rhonchi or crepitation. No use of accessory muscles of respiration.  CARDIOVASCULAR: S1, S2 normal. No murmurs, rubs, or gallops.  ABDOMEN: Soft, nontender, nondistended. Bowel sounds present. No organomegaly or mass.  EXTREMITIES: No pedal edema, cyanosis, or clubbing.  NEUROLOGIC: Cranial nerves II through XII are intact. Muscle strength 5/5 in all extremities. Sensation intact. Gait not checked.  PSYCHIATRIC: The patient is alert and oriented x 3.  SKIN: No obvious rash, lesion, or ulcer.   DATA REVIEW:   CBC Recent Labs  Lab 02/20/19 0427  WBC 4.3  HGB 13.5  HCT 39.2  PLT 231    Chemistries  Recent Labs  Lab 02/19/19 1402 02/20/19 0427  NA 134* 136  K 4.0 3.5  CL 101 101  CO2 24 26  GLUCOSE 290* 168*  BUN 12 9  CREATININE 0.78 0.72  CALCIUM 8.8* 8.7*  AST 17  --   ALT 19  --   ALKPHOS 73  --   BILITOT 1.1  --      Microbiology Results  Results for orders placed or performed during the hospital encounter of 02/19/19  SARS Coronavirus 2 [facility] order, Performed in  [facility] hospital lab) Nasopharyngeal Nasopharyngeal Swab     Status: None   Collection Time: 02/19/19  3:51 PM   Specimen: Nasopharyngeal Swab  Result Value Ref Range Status   SARS Coronavirus 2 NEGATIVE NEGATIVE Final    Comment: (NOTE) If result is NEGATIVE SARS-CoV-2 target nucleic acids are NOT DETECTED. The SARS-CoV-2 RNA is generally detectable in upper and lower  respiratory specimens during the acute phase of infection. The lowest  concentration of SARS-CoV-2 viral copies this assay can detect is 250  copies / mL. A negative result does not preclude SARS-CoV-2 infection  and should not be used as the sole basis for treatment or other  patient management decisions.  A negative result may occur with  improper specimen collection / handling, submission of specimen other  than nasopharyngeal swab, presence of viral mutation(s) within the  areas targeted by this assay, and inadequate number of viral copies  (<250 copies / mL). A negative  result must be combined with clinical  observations, patient history, and epidemiological information. If result is POSITIVE SARS-CoV-2 target nucleic acids are DETECTED. The SARS-CoV-2 RNA is generally detectable in upper and lower  respiratory specimens dur ing the acute phase of infection.  Positive  results are indicative of active infection with SARS-CoV-2.  Clinical  correlation with patient history and other diagnostic information is  necessary to determine patient infection status.  Positive results do  not rule out bacterial infection or co-infection with other viruses. If result is PRESUMPTIVE POSTIVE SARS-CoV-2 nucleic acids MAY BE PRESENT.   A presumptive positive result was obtained on the submitted specimen  and confirmed on repeat testing.  While 2019 novel coronavirus  (SARS-CoV-2) nucleic acids may be present in the submitted sample  additional confirmatory testing may be necessary for epidemiological  and / or clinical  management purposes  to differentiate between  SARS-CoV-2 and other Sarbecovirus currently known to infect humans.  If clinically indicated additional testing with an alternate test  methodology [phone]([id number]) is advised. The SARS-CoV-2 RNA is generally  detectable in upper and lower respiratory sp ecimens during the acute  phase of infection. The expected result is Negative. Fact Sheet for Patients:  BoilerBrush.com.cyhttps://www.fda.gov/media/136312/download Fact Sheet for Healthcare Providers: https://pope.com/https://www.fda.gov/media/136313/download This test is not yet approved or cleared by the [place] FDA and has been authorized for detection and/or diagnosis of SARS-CoV-2 by FDA under an Emergency Use Authorization (EUA).  This EUA will remain in effect (meaning this test can be used) for the duration of the COVID-19 declaration under Section 564(b)(1) of the Act, 21 U.S.C. section 360bbb-3(b)(1), unless the authorization is terminated or revoked sooner. Performed at [facility], [address] [place], [place] [address]     RADIOLOGY:  No results found.   Management plans discussed with the patient, family and they are in agreement.  CODE STATUS:     Code Status Orders  (From admission, onward)         Start     Ordered   02/19/19 1856  Full code  Continuous     02/19/19 1855        Code Status History    This patient has a current code status but no historical code status.   Advance Care Planning Activity      TOTAL TIME TAKING CARE OF THIS PATIENT: 38 minutes.    Enid Baasadhika Greydis Stlouis M.D on 02/21/2019 at 2:05 PM  Between 7am to 6pm - Pager - [phone]  After 6pm go to [email or web address] - Social research officer, governmentpassword [email or web address] [email or web address]  [facility] [place] [facility]  Office  [phone]  CC: Primary care physician; Patient, No Pcp Per   Note: This dictation was prepared with Dragon dictation along with smaller phrase technology. Any transcriptional errors that result from this process  are unintentional.

## 2019-02-21 NOTE — Progress Notes (Addendum)
Inpatient Diabetes Program Recommendations  AACE/ADA: New Consensus Statement on Inpatient Glycemic Control (2015)  Target Ranges:  Prepandial:   less than 140 mg/dL      Peak postprandial:   less than 180 mg/dL (1-2 hours)      Critically ill patients:  140 - 180 mg/dL   Results for Kirk Edwards, Kirk Edwards (MRN [id number]) as of 02/21/2019 11:00  Ref. Range 02/20/2019 07:51 02/20/2019 11:39 02/20/2019 17:01 02/20/2019 21:04  Glucose-Capillary Latest Ref Range: 70 - 99 mg/dL 126 (H) 189 (H) 140 (H) 152 (H)   Results for Kirk Edwards, Kirk Edwards (MRN [id number]) as of 02/21/2019 11:00  Ref. Range 02/20/2019 04:27  Hemoglobin A1C Latest Ref Range: 4.8 - 5.6 % 12.1 (H)  (301 mg/dl)    Admit with: CP/ Left neck pain with left arm radiculopathy  History: DM, Polysubstance Abuse  Home DM Meds: Levemir 10 units Daily       Januvia 25 mg Daily  Current Orders: Lantus 10 units QHS      Novolog Sensitive Correction Scale/ SSI (0-9 units) TID AC + HS     PCP: [facility] in [place] from there as well.  Current A1c of 12.1% shows extremely poor glucose control at home.  Needs close follow-up with PCP for better DM management.  Will try to see pt today prior to d/c home.  Addendum 1pm--Unfortunately pt d/c'd home before I could get to his room to speak with him.     --Will follow patient during hospitalization--  Wyn Quaker RN, MSN, CDE Diabetes Coordinator Inpatient Glycemic Control Team Team Pager: [phone] (8a-5p)

## 2019-02-21 NOTE — Progress Notes (Signed)
Patient alert and oriented, vss.  D/c piv.  Given discharge instructions, f/u appt and a sling for his left arm.  He will be discharge home with his sister.  Will be escorted out of hospital via wheelchair by nursing stafff.

## 2019-02-25 LAB — ALDOSTERONE + RENIN ACTIVITY W/ RATIO
ALDO / PRA Ratio: >15.6 (ref 0.0–30.0)
Aldosterone: 2.6 ng/dL (ref 0.0–30.0)
PRA LC/MS/MS: <0.167 ng/mL/hr — ABNORMAL LOW (ref 0.167–5.380)

## 2021-03-22 LAB — HEMOGLOBIN A1C
Estimated Avg Glucose, External: 195 mg/dL — ABNORMAL HIGH (ref 91–123)
Hemoglobin A1C, External: 8.4 % — ABNORMAL HIGH (ref 4.8–5.6)

## 2021-05-26 IMAGING — CT CT ABDOMEN W/O CM
2 of 4 series · 16 of 46 positions shown, 18 images · non-contrast
Comparison: CTA chest earlier today

CLINICAL DATA: Left adrenal nodule

EXAM:
CT ABDOMEN WITHOUT CONTRAST
TECHNIQUE: Multidetector CT imaging of the abdomen was performed following the
standard protocol without IV contrast.

[Series 2: axial st · axial · 0.77mm/px · z∈[-904,-667]mm · 13 of 87 slices shown, 15 images]
[im 4/87  soft-tissue]
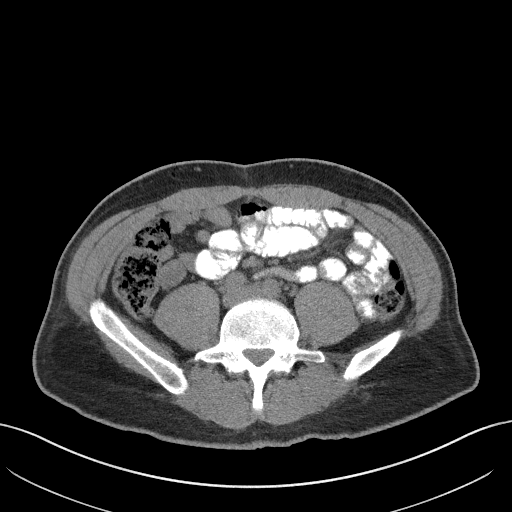
[im 4/87  bone]
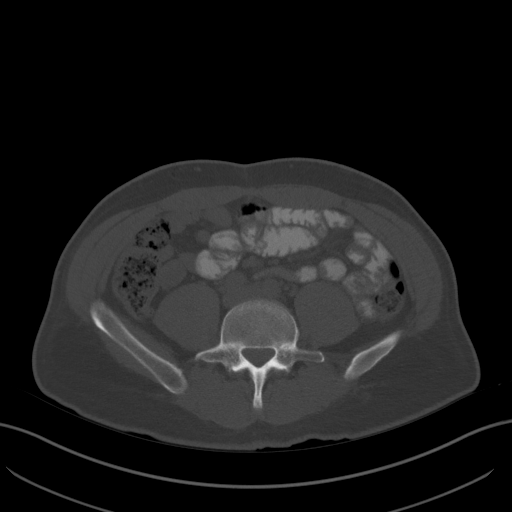
[im 12/87  soft-tissue]
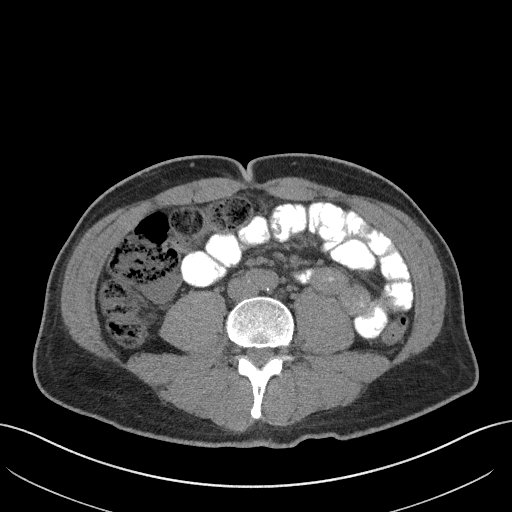
[im 20/87  soft-tissue]
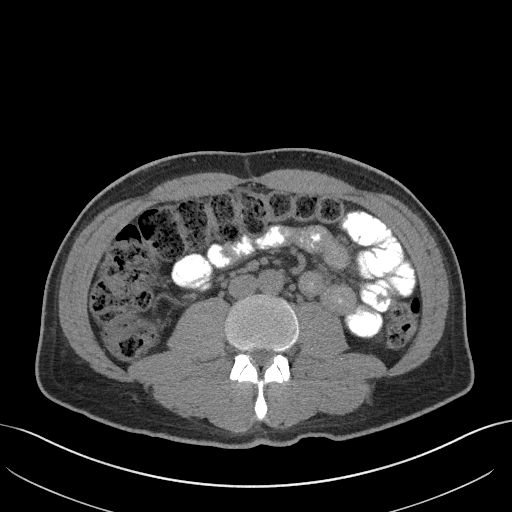
[im 24/87  soft-tissue]
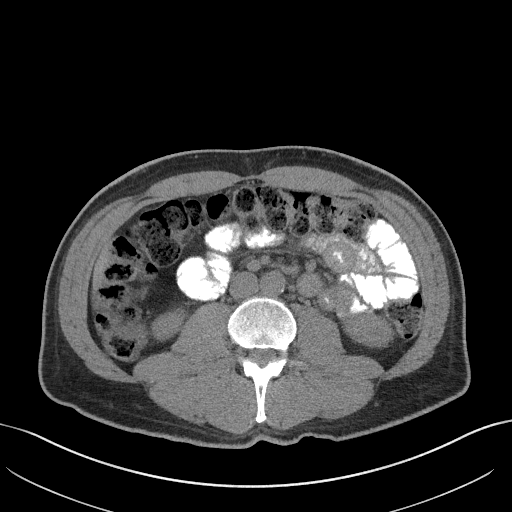
[im 32/87  soft-tissue]
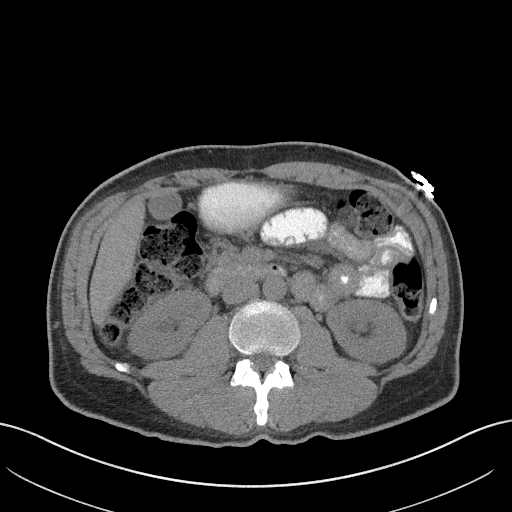
[im 36/87  soft-tissue]
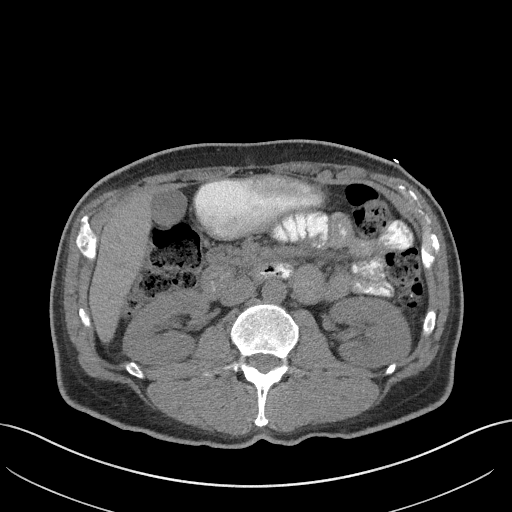
[im 44/87  soft-tissue]
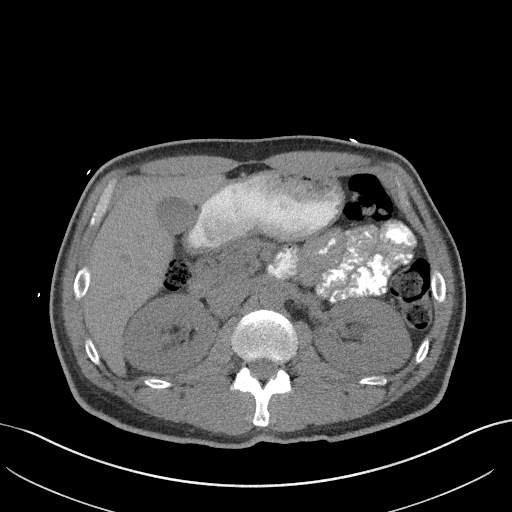
[im 51/87  soft-tissue]
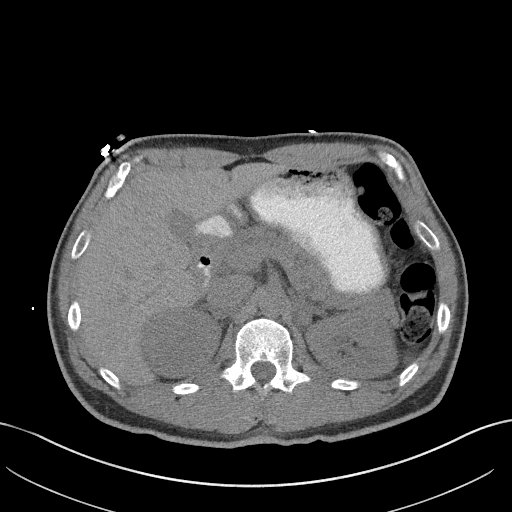
[im 55/87  soft-tissue]
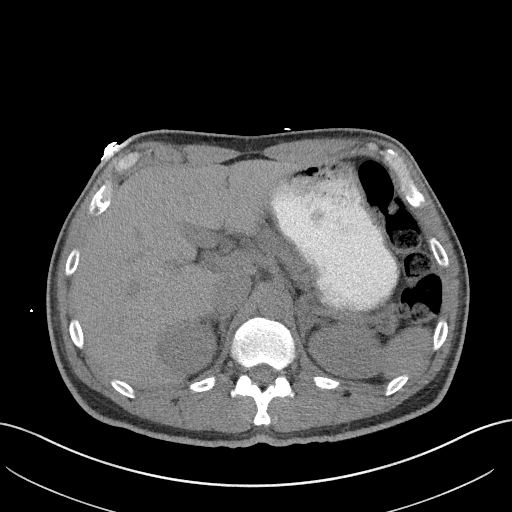
[im 55/87  bone]
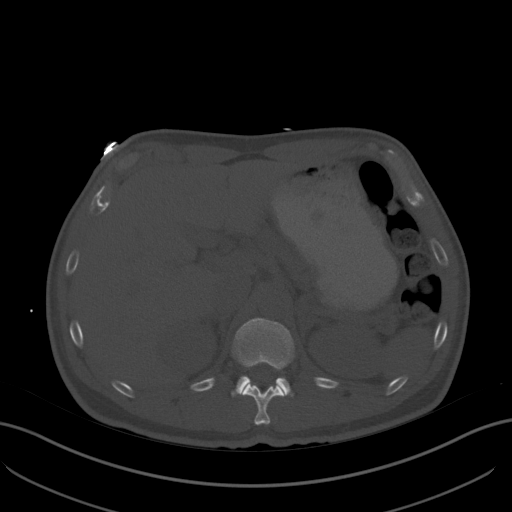
[im 63/87  soft-tissue]
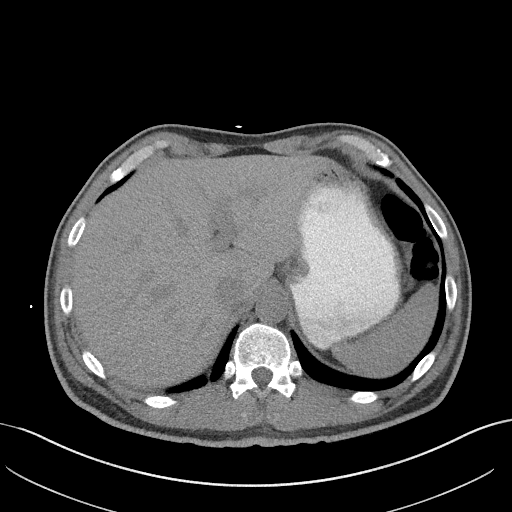
[im 67/87  soft-tissue]
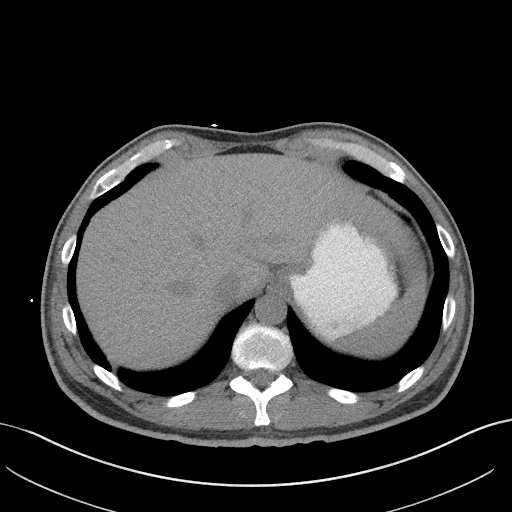
[im 75/87  soft-tissue]
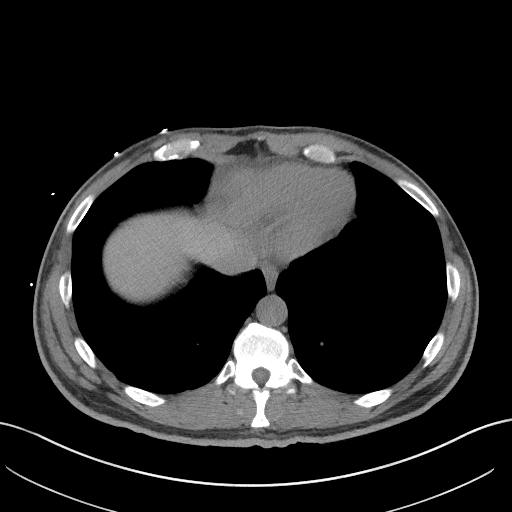
[im 83/87  soft-tissue]
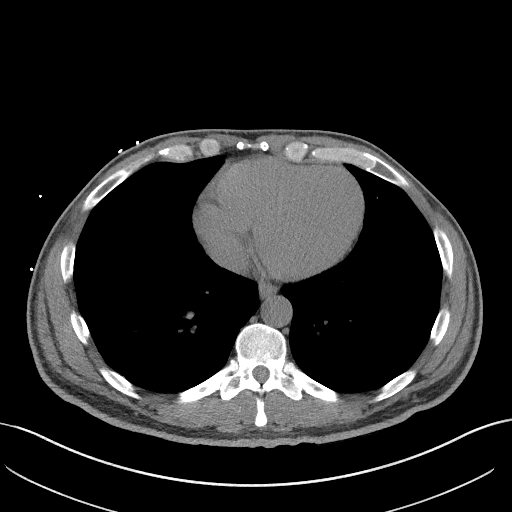

[Series 5: coronal wo · coronal · 0.51mm/px · 3 of 120 slices shown]
[im 40/120  soft-tissue]
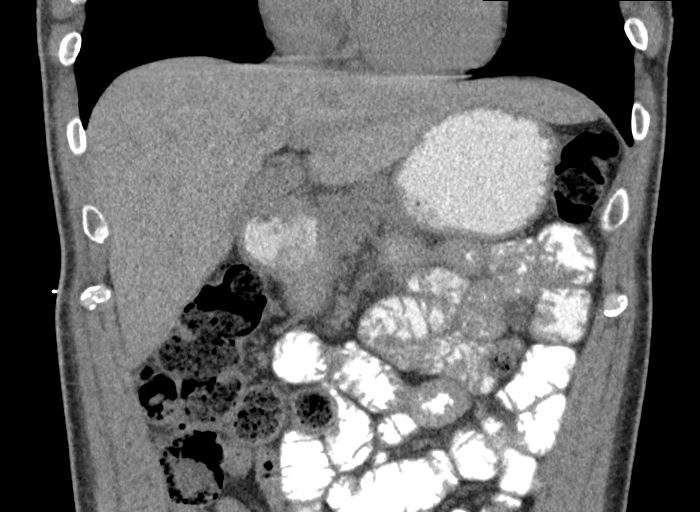
[im 53/120  soft-tissue]
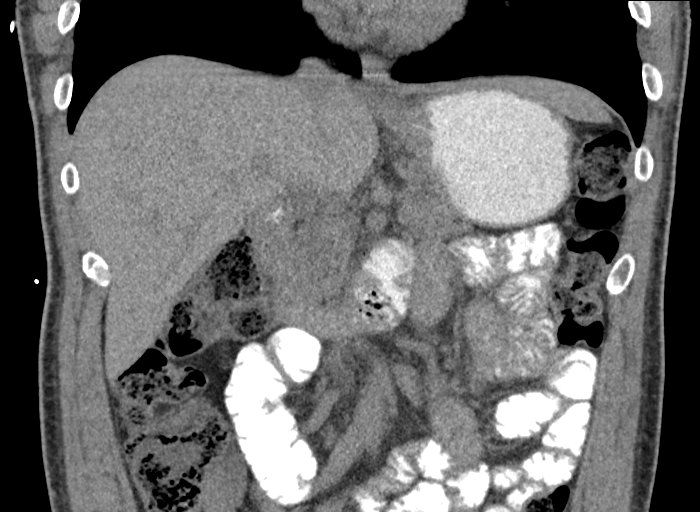
[im 67/120  soft-tissue]
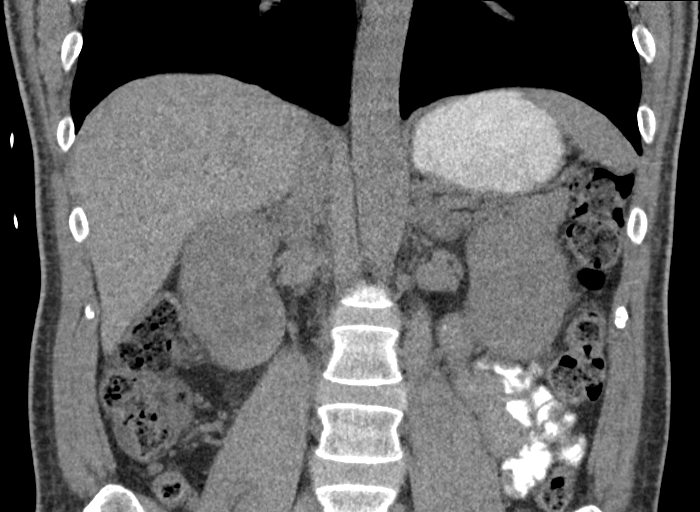

[16 of 46 positions shown; findings below may reference images not displayed]

FINDINGS: Lower chest: Lung bases are clear. No effusions. Heart is normal
size.

Hepatobiliary: No focal hepatic abnormality. Gallbladder
unremarkable.

Pancreas: No focal abnormality or ductal dilatation.

Spleen: No focal abnormality.  Normal size.

Adrenals/Urinary Tract: Fullness and probable nodule in the left
adrenal gland measures approximately 14 x 12 mm. This has a density
of approximately 42 Hounsfield units, nondiagnostic for adenoma.
Right adrenal gland, kidneys have an unremarkable unenhanced
appearance. No hydronephrosis.

Stomach/Bowel: Stomach, large and small bowel unremarkable. Appendix
is normal. Moderate stool throughout the colon.

Vascular/Lymphatic: Scattered aortic calcifications. No evidence of
aneurysm or adenopathy.

Other: No free fluid or free air.

Musculoskeletal: No acute bony abnormality.
IMPRESSION: Small nodule in the left adrenal gland. Density is not diagnostic
for adrenal adenoma. This warrants further evaluation with adrenal
protocol MRI.

## 2021-05-26 IMAGING — CT CT ANGIO CHEST
4 of 7 series · 18 of 46 positions shown · IV contrast (APPLIED)
Comparison: None.

CLINICAL DATA: Per physician note: Primary complaint of left
shoulder pain with some pain in the back. No shortness of breath or
pleurisy. Pain started this morning upon waking. Is never had this
before. EKG overall reassuring, mild tachycardia.

EXAM:
CT ANGIOGRAPHY CHEST WITH CONTRAST
TECHNIQUE: Multidetector CT imaging of the chest was performed using the
standard protocol during bolus administration of intravenous
contrast. Multiplanar CT image reconstructions and MIPs were
obtained to evaluate the vascular anatomy.
CONTRAST:  75mL OMNIPAQUE IOHEXOL 350 MG/ML SOLN

[Series 3: axial pre · axial · non-contrast · 0.76mm/px · z∈[-316,-101]mm · 5 of 65 slices shown]
[im 11/65  lung]
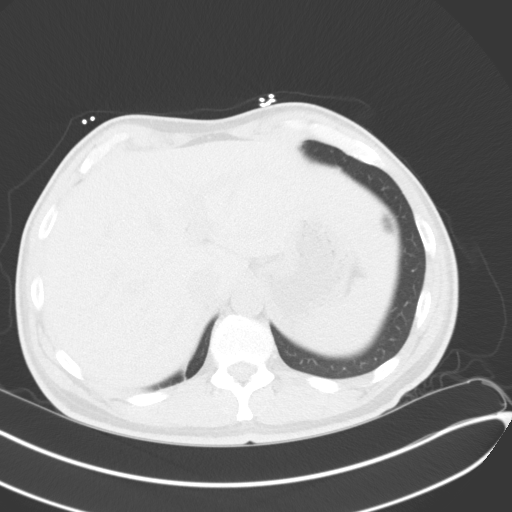
[im 22/65  lung]
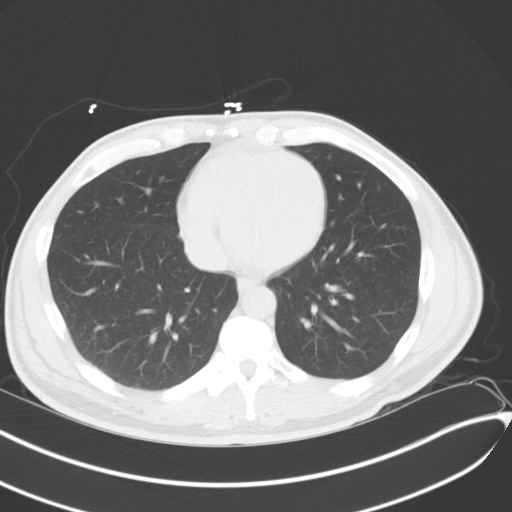
[im 33/65  lung]
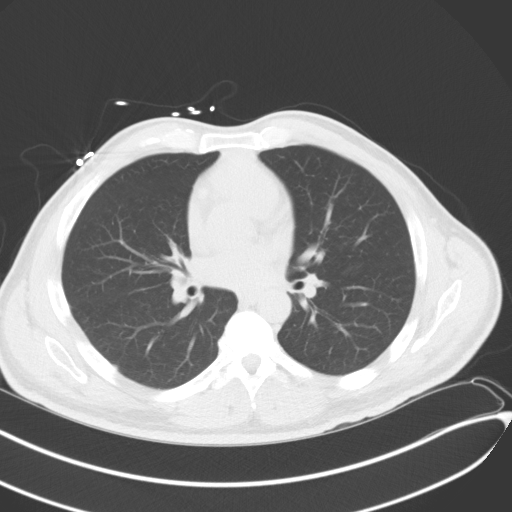
[im 43/65  lung]
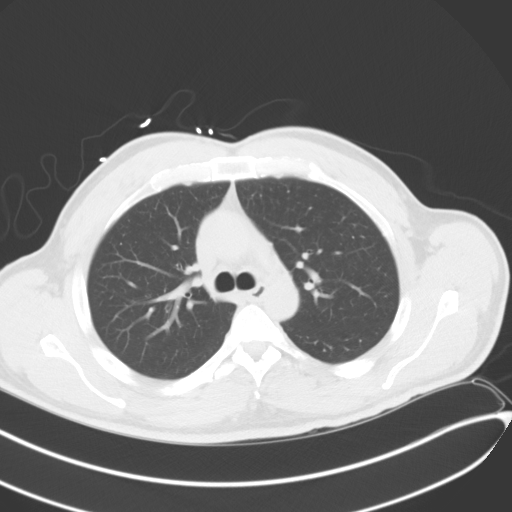
[im 54/65  lung]
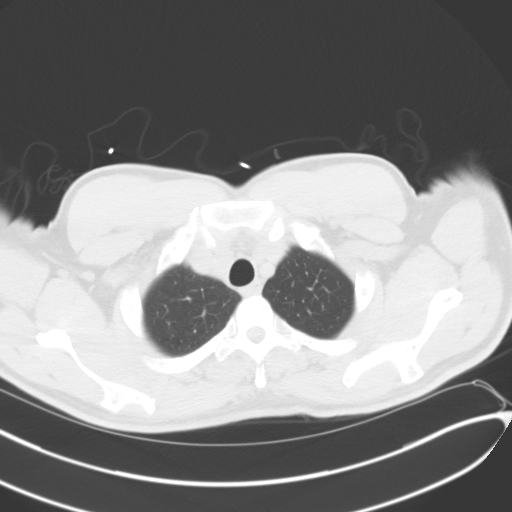

[Series 6: axial arterial · axial · arterial · 0.76mm/px · z∈[-331,-82]mm · 8 of 107 slices shown]
[im 12/107  lung]
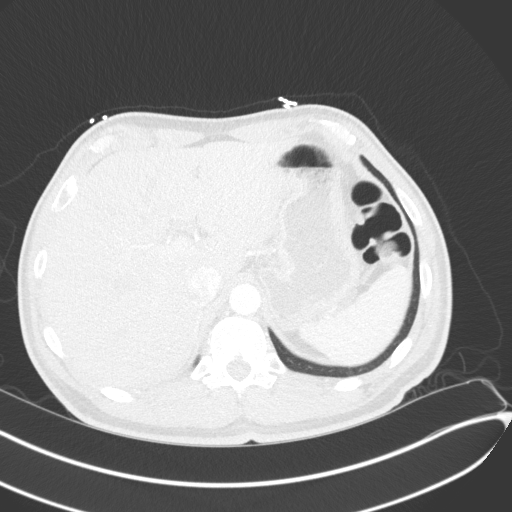
[im 24/107  soft-tissue]
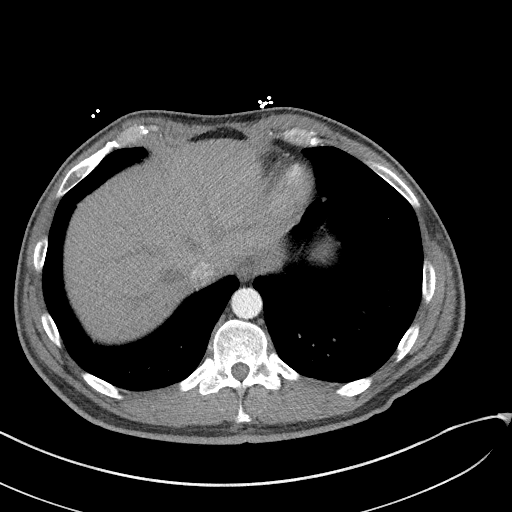
[im 36/107  lung]
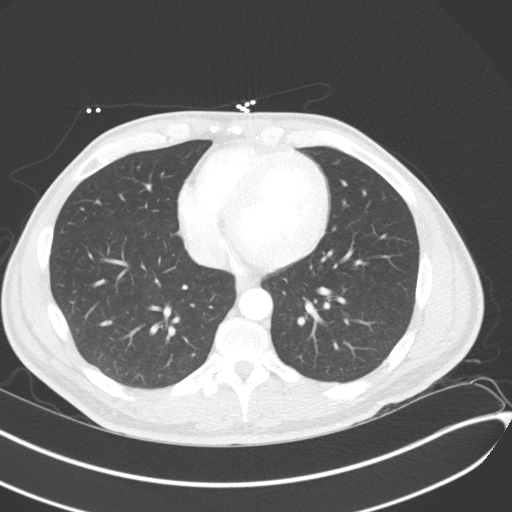
[im 48/107  soft-tissue]
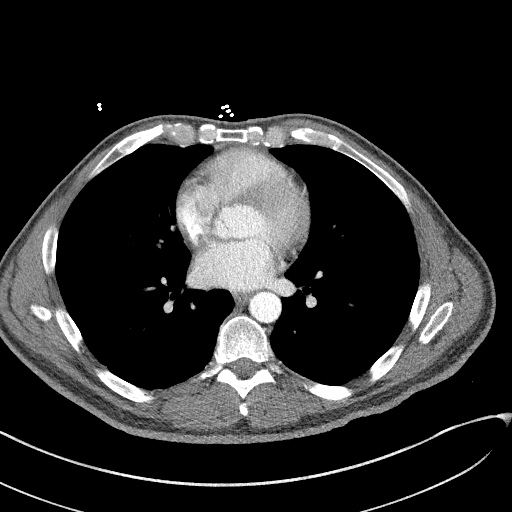
[im 59/107  lung]
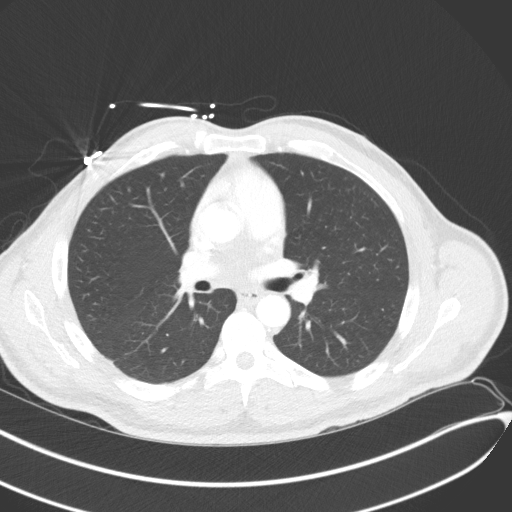
[im 71/107  soft-tissue]
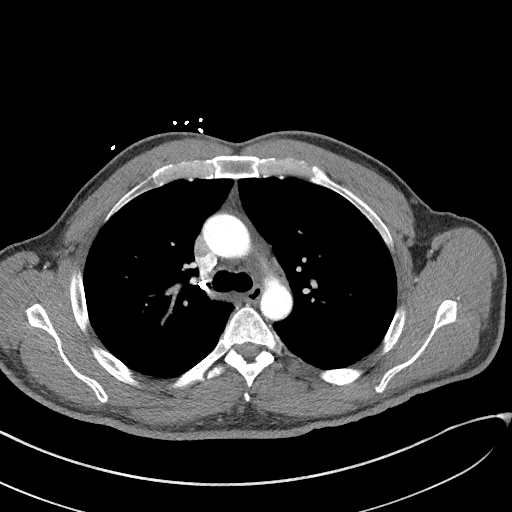
[im 83/107  lung]
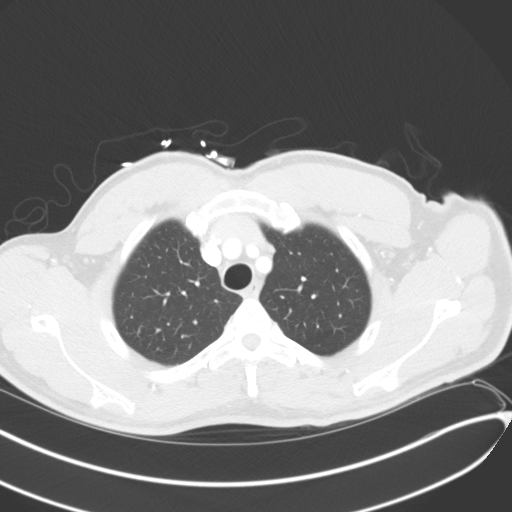
[im 95/107  soft-tissue]
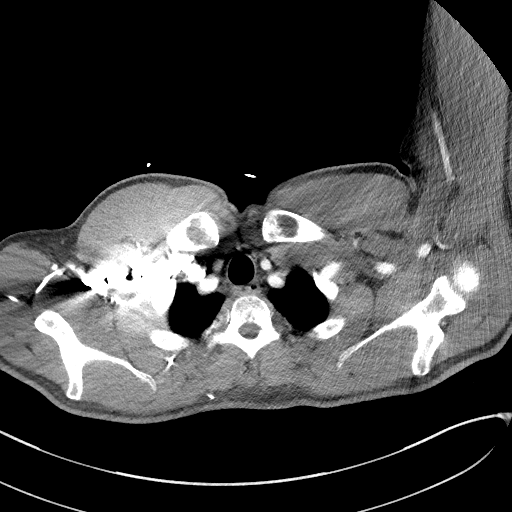

[Series 7: lung · axial · 0.76mm/px · z∈[-342,-296]mm · 2 of 160 slices shown]
[im 12/160  soft-tissue]
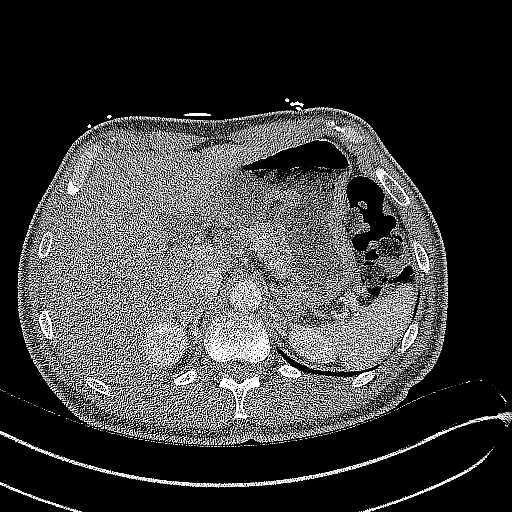
[im 35/160  soft-tissue]
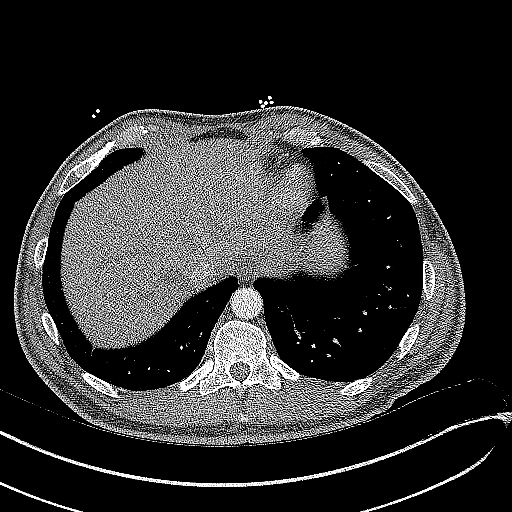

[Series 9: coronals · coronal · 0.62mm/px · 3 of 124 slices shown]
[im 31/124  soft-tissue]
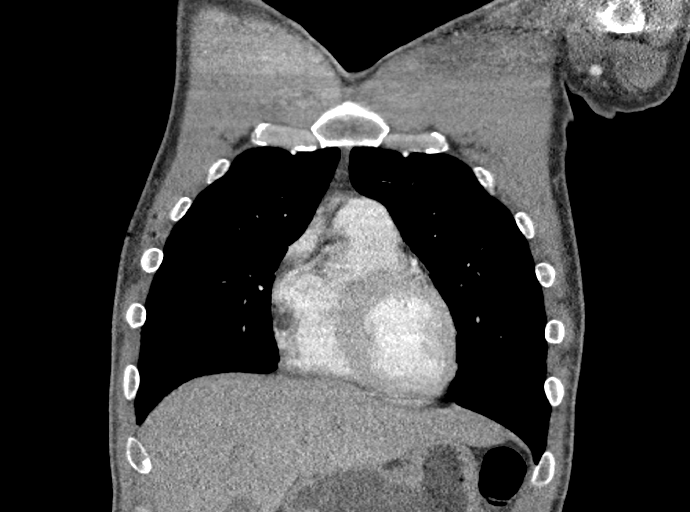
[im 62/124  soft-tissue]
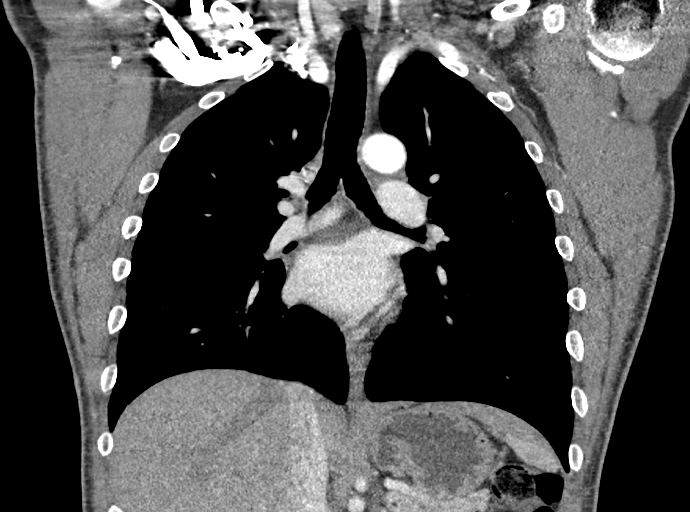
[im 93/124  soft-tissue]
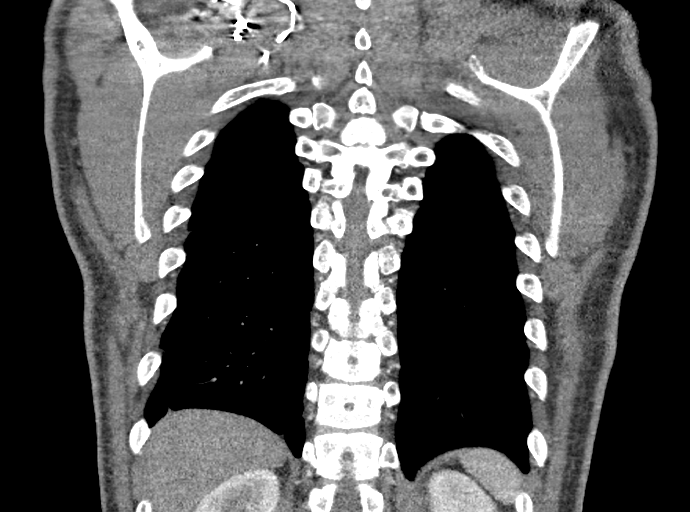

[18 of 46 positions shown; findings below may reference images not displayed]

FINDINGS: Cardiovascular: Preferential opacification of the thoracic aorta.
Motion somewhat limits evaluation of the aortic root and ascending
aorta. No evidence of thoracic aortic aneurysm or dissection. Normal
heart size. No pericardial effusion.

Mediastinum/Nodes: No enlarged mediastinal, hilar, or axillary lymph
nodes. Thyroid gland, trachea, and esophagus demonstrate no
significant findings.

Lungs/Pleura: Minimal linear opacities at the right lung base likely
scarring or atelectasis. The left lung is clear. No pleural effusion
or pneumothorax. No suspicious pulmonary masses.

Upper Abdomen: Left adrenal gland nodule measuring 2.2 cm (series 6,
image 105) with attenuation greater than expected for an adenoma.
Upper abdomen is otherwise unremarkable.

Musculoskeletal: No chest wall abnormality. No acute or significant
osseous findings.

Review of the MIP images confirms the above findings.
IMPRESSION: 1. Motion somewhat limits evaluation of the aortic root and
ascending aorta. No evidence of thoracic aortic aneurysm or
dissection.
2. Left adrenal gland nodule measuring 2.2 cm with attenuation
greater than expected for an adenoma. Further evaluation with
adrenal protocol CT or MRI is recommended on a nonemergent basis.

## 2021-06-21 LAB — HEMOGLOBIN A1C
Estimated Avg Glucose, External: 150 mg/dL — ABNORMAL HIGH (ref 91–123)
Hemoglobin A1C, External: 6.9 % — ABNORMAL HIGH (ref 4.8–5.6)

## 2021-09-06 ENCOUNTER — Inpatient Hospital Stay
Admit: 2021-09-06 | Discharge: 2021-09-06 | Disposition: A | Discharge status: Home / Self Care | Payer: MEDICAID | Attending: Emergency Medicine

## 2021-09-06 ENCOUNTER — Emergency Department: Admit: 2021-09-06 | Payer: MEDICAID | Primary: Diagnostic Radiology

## 2021-09-06 DIAGNOSIS — S8001XA Contusion of right knee, initial encounter: Secondary | ICD-10-CM

## 2021-09-06 MED ORDER — IBUPROFEN 400 MG PO TABS
400 MG | ORAL | Status: DC
Start: 2021-09-06 — End: 2021-09-06

## 2021-09-06 MED ORDER — IBUPROFEN 800 MG PO TABS
800 MG | ORAL_TABLET | Freq: Three times a day (TID) | ORAL | 0 refills | Status: AC | PRN
Start: 2021-09-06 — End: ?

## 2021-09-06 MED ORDER — OXYCODONE-ACETAMINOPHEN 5-325 MG PO TABS
5-325 MG | ORAL | Status: AC
Start: 2021-09-06 — End: 2021-09-06
  Administered 2021-09-06: 08:00:00 1 via ORAL

## 2021-09-06 MED FILL — OXYCODONE-ACETAMINOPHEN 5-325 MG PO TABS: 5-325 MG | ORAL | Qty: 1

## 2021-09-06 NOTE — ED Provider Notes (Signed)
[facility] EMERGENCY DEPT  EMERGENCY DEPARTMENT ENCOUNTER    Patient Name: Cole White  MRN: [id number]  Birth Date: 1976/05/01  Provider: Jeanella Craze, MD  PCP: PROVIDER Verita Schneiders, AGPCNP   Time/Date of evaluation: 3:28 AM EDT on 09/06/21    History of Presenting Illness     Chief Complaint   Patient presents with    Knee Pain       History Provided by: Patient   History is limited by: Nothing    HISTORY Cole White):   Cole White is a 46 y.o. male with a PMHX of diabetes  who presents to the emergency department (room Q5) by POV C/O right knee pain onset 11:00 PM last night (05 September 2021) after falling and hitting the knee while bowling. Associated sxs include radiation to the foot. Pt denies any other sxs or complaints.  Patient states no relief at home with ibuprofen and ice.    Nursing Notes were all reviewed and agreed with or any disagreements were addressed in the HPI.    Past History     PAST MEDICAL HISTORY:  No past medical history on file.    PAST SURGICAL HISTORY:  No past surgical history on file.    FAMILY HISTORY:  No family history on file.    SOCIAL HISTORY:       MEDICATIONS:  No current facility-administered medications for this encounter.     Current Outpatient Medications   Medication Sig Dispense Refill    ibuprofen (ADVIL;MOTRIN) 800 MG tablet Take 1 tablet by mouth every 8 hours as needed for Pain 30 tablet 0       ALLERGIES:  No Known Allergies    SOCIAL DETERMINANTS OF HEALTH:  Social Determinants of Health     Tobacco Use: Not on file   Alcohol Use: Not on file   Financial Resource Strain: Not on file   Food Insecurity: Not on file   Transportation Needs: Not on file   Physical Activity: Not on file   Stress: Not on file   Social Connections: Not on file   Intimate Partner Violence: Not on file   Depression: Not on file   Housing Stability: Not on file       Review of Systems     Negative except as listed above in HPI.    Physical Exam     Vitals:    09/06/21 0330   BP: 131/62   Pulse: 88    Resp: 18   Temp: 97.3 F (36.3 C)   TempSrc: Temporal   SpO2: 98%   Weight: 218 lb (98.9 kg)   Height: '6\' 3"'  (1.905 m)       Physical Exam  Vitals and nursing note reviewed.   Constitutional:       General: He is not in acute distress.     Appearance: Normal appearance. He is normal weight. He is not ill-appearing.   HENT:      Head: Normocephalic and atraumatic.      Nose: Nose normal. No rhinorrhea.      Mouth/Throat:      Mouth: Mucous membranes are moist.      Pharynx: No oropharyngeal exudate or posterior oropharyngeal erythema.   Eyes:      General:         Right eye: No discharge.         Left eye: No discharge.      Extraocular Movements: Extraocular movements intact.      Conjunctiva/sclera:  Conjunctivae normal.      Pupils: Pupils are equal, round, and reactive to light.   Cardiovascular:      Rate and Rhythm: Normal rate and regular rhythm.      Heart sounds: No murmur heard.    No friction rub. No gallop.   Pulmonary:      Effort: Pulmonary effort is normal. No respiratory distress.      Breath sounds: No wheezing, rhonchi or rales.   Abdominal:      General: Bowel sounds are normal.      Palpations: Abdomen is soft.      Tenderness: There is no abdominal tenderness. There is no guarding or rebound.   Musculoskeletal:         General: No swelling or deformity.      Cervical back: Normal range of motion and neck supple. No rigidity.      Right knee: No swelling, deformity, effusion, erythema, ecchymosis, lacerations, bony tenderness or crepitus. Decreased range of motion. Tenderness present. No LCL laxity, MCL laxity, ACL laxity or PCL laxity. Normal alignment, normal meniscus and normal patellar mobility. Normal pulse.      Instability Tests: Posterior drawer test negative. Anterior Lachman test negative. Medial McMurray test negative and lateral McMurray test negative.      Left knee: Normal.   Lymphadenopathy:      Cervical: No cervical adenopathy.   Skin:     General: Skin is warm and dry.       Findings: No rash.   Neurological:      General: No focal deficit present.      Mental Status: He is alert and oriented to person, place, and time.   Psychiatric:         Mood and Affect: Mood normal.         Behavior: Behavior normal.       Diagnostic Study Results     LABS:  No results found for this or any previous visit (from the past 12 hour(s)).    RADIOLOGIC STUDIES:   Non x-ray images such as CT, Ultrasound and MRI are read by the radiologist. X-ray images are visualized and preliminarily interpreted by the ED Provider with the findings as listed in the ED Course section below.     Interpretation per the Radiologist is listed below, if available at the time of this note:    XR KNEE RIGHT (3 VIEWS)   Final Result   Moderate to large joint effusion. No definite fractures identified..          Procedures     Procedures    ED Course     3:28 AM EDT I Jeanella Craze, MD) am the first provider for this patient. Initial assessment performed. I reviewed the vital signs, available nursing notes, past medical history, past surgical history, family history and social history. The patients presenting problems have been discussed, and they are in agreement with the care plan formulated and outlined with them.  I have encouraged them to ask questions as they arise throughout their visit.    RECORDS REVIEWED: Nursing Notes    Is this patient to be included in the SEP-1 core measure due to severe sepsis or septic shock? No Exclusion criteria - the patient is NOT to be included for SEP-1 Core Measure due to: 2+ SIRS criteria are not met    MEDICATIONS ADMINISTERED IN THE ED:  Medications   oxyCODONE-acetaminophen (PERCOCET) 5-325 MG per tablet 1 tablet (1 tablet Oral Given  09/06/21 0400)       ED Course as of 09/06/21 0458   Tue Sep 06, 2021   0435 RADIOLOGY FINDINGS  Right knee x-ray shows no acute fracture or dislocation.  Interpreted by Jeanella Craze, MD.  Pending review by Radiologist   [JM]      ED Course User  Index  [JM] Sheran Fava, MD       None    Medical Decision Making     SCREENING TOOLS:  None    DDX: Sprain, strain, fracture, dislocation, contusion, abrasion, laceration    DISCUSSION:  This appears to be a mild contion. This appears to be an acute condition.    46 y.o. male with right knee pain after falling on the knee while bowling today.  In evaluation of the above differential diagnosis, consideration was given to the following tests and treatments.  X-ray of the right knee did not demonstrate any acute findings dislocation or fracture.  Patient was placed on crutches and a knee immobilizer.  He may follow-up as outpatient with his primary care doctor or with orthopedics. The decision to perform testing and results were discussed with the patient. I discussed each of these tests and considerations with the patient. They agree with the plan of discharge.    ADDITIONAL CONSIDERATIONS:  None    Critical Care Time:     None    Jeanella Craze, MD    Diagnosis and Disposition     DISPOSITION Decision To Discharge 09/06/2021 04:53:53 AM    DISCHARGE NOTE:  [facility]  results have been reviewed with him.  He has been counseled regarding his diagnosis, treatment, and plan.  He verbally conveys understanding and agreement of the signs, symptoms, diagnosis, treatment and prognosis and additionally agrees to follow up as discussed.  He also agrees with the care-plan and conveys that all of his questions have been answered.  I have also provided discharge instructions for him that include: educational information regarding their diagnosis and treatment, and list of reasons why they would want to return to the ED prior to their follow-up appointment, should his condition change. He has been provided with education for proper emergency department utilization.     CLINICAL IMPRESSION:  1. Contusion of right knee, initial encounter    2. Effusion of right knee        PLAN:  D/C Home    DISCHARGE  MEDICATIONS:  Current Discharge Medication List             Details   ibuprofen (ADVIL;MOTRIN) 800 MG tablet Take 1 tablet by mouth every 8 hours as needed for Pain  Qty: 30 tablet, Refills: 0             DISCONTINUED MEDICATIONS:  Current Discharge Medication List          PATIENT REFERRED TO:  Follow Up with:  Edison Nasuti, MD  [address]  [address]  [place] [address]  [phone]    Schedule an appointment as soon as possible for a visit   As soon as possible, For follow up from the Emergency Department    [facility]  [place]  [phone]    As needed, If symptoms worsen      I Jeanella Craze, MD am the primary clinician of record.    Dragon Disclaimer     Please note that this dictation was completed with Dragon,  the computer voice recognition software.  Quite often unanticipated grammatical, syntax, homophones, and other interpretive errors are inadvertently transcribed by the computer software.  Please disregard these errors.  Please excuse any errors that have escaped final proofreading.    Jeanella Craze, MD  (Electronically signed)            Sheran Fava, MD  09/06/21 [phone]

## 2021-09-06 NOTE — Discharge Instructions (Signed)
Follow-up with your primary care doctor or with orthopedics.  You will need to call to make an appointment.  Return to the ED for worsening symptoms or for other concerns.

## 2021-09-06 NOTE — ED Triage Notes (Signed)
Pt arrived with c.o R knee pain after falling around 11pm while [place]. Pt reports leg is hurting from the knee to the foot.   Pt took ibuprofen with no relief.

## 2021-09-13 LAB — HEMOGLOBIN A1C
Estimated Avg Glucose, External: 139 mg/dL — ABNORMAL HIGH (ref 91–123)
Hemoglobin A1C, External: 6.5 % — ABNORMAL HIGH (ref 4.8–5.6)

## 2022-04-26 LAB — HEMOGLOBIN A1C
Estimated Avg Glucose, External: 129 mg/dL — ABNORMAL HIGH (ref 91–123)
Hemoglobin A1C, External: 6.1 % — ABNORMAL HIGH (ref 4.8–5.6)

## 2022-09-08 LAB — HEMOGLOBIN A1C
Estimated Avg Glucose, External: 177 mg/dL — ABNORMAL HIGH (ref 91–123)
Hemoglobin A1C, External: 7.8 % — ABNORMAL HIGH (ref 4.8–5.6)

## 2022-12-08 LAB — HEMOGLOBIN A1C
Estimated Avg Glucose, External: 208 mg/dL — ABNORMAL HIGH (ref 91–123)
Hemoglobin A1C, External: 8.9 % — ABNORMAL HIGH (ref 4.8–5.6)

## 2023-01-01 LAB — HEMOGLOBIN A1C
Estimated Avg Glucose, External: 190 mg/dL — ABNORMAL HIGH (ref 91–123)
Hemoglobin A1C, External: 8.3 % — ABNORMAL HIGH (ref 4.8–5.6)

## 2023-02-23 LAB — HEMOGLOBIN A1C
Estimated Avg Glucose, External: 163 mg/dL — ABNORMAL HIGH (ref 91–123)
Hemoglobin A1C, External: 7.3 % — ABNORMAL HIGH (ref 4.8–5.6)

## 2023-06-13 LAB — HEMOGLOBIN A1C
Estimated Avg Glucose, External: 184 mg/dL — ABNORMAL HIGH (ref 91–123)
Hemoglobin A1C, External: 8 % — ABNORMAL HIGH (ref 4.8–5.6)

## 2023-10-10 LAB — HEMOGLOBIN A1C
Estimated Avg Glucose, External: 114 mg/dL (ref 91–123)
Hemoglobin A1C, External: 5.6 % (ref 4.8–5.6)

## 2024-02-02 ENCOUNTER — Emergency Department: Admit: 2024-02-02 | Payer: Medicaid (Managed Care) | Primary: Diagnostic Radiology

## 2024-02-02 ENCOUNTER — Inpatient Hospital Stay
Admit: 2024-02-02 | Discharge: 2024-02-02 | Disposition: A | Discharge status: Home / Self Care | Payer: Medicaid (Managed Care) | Arrived: AM | Attending: Emergency Medicine

## 2024-02-02 DIAGNOSIS — R112 Nausea with vomiting, unspecified: Principal | ICD-10-CM

## 2024-02-02 LAB — COMPREHENSIVE METABOLIC PANEL
ALT: 18 U/L (ref 10–50)
AST: 23 U/L (ref 10–38)
Albumin/Globulin Ratio: 0.6 — ABNORMAL LOW (ref 0.8–1.7)
Albumin: 3.1 g/dL — ABNORMAL LOW (ref 3.4–5.0)
Alk Phosphatase: 70 U/L (ref 45–117)
Anion Gap: 12 mmol/L (ref 3–18)
BUN/Creatinine Ratio: 3 — ABNORMAL LOW (ref 12–20)
BUN: 20 mg/dL (ref 6–23)
CO2: 28 mmol/L (ref 21–32)
Calcium: 9 mg/dL (ref 8.5–10.1)
Chloride: 104 mmol/L (ref 98–107)
Creatinine: 6.54 mg/dL — ABNORMAL HIGH (ref 0.60–1.30)
Est, Glom Filt Rate: 10 ml/min/1.73m2 — ABNORMAL LOW (ref >60)
Globulin: 4.9 g/dL — ABNORMAL HIGH (ref 2.0–4.0)
Glucose: 70 mg/dL — ABNORMAL LOW (ref 74–108)
Potassium: 3.5 mmol/L (ref 3.5–5.5)
Sodium: 143 mmol/L (ref 136–145)
Total Bilirubin: 0.6 mg/dL (ref 0.2–1.0)
Total Protein: 8 g/dL (ref 6.4–8.2)

## 2024-02-02 LAB — CBC WITH AUTO DIFFERENTIAL
Basophils %: 1.1 % (ref 0.0–2.0)
Basophils Absolute: 0.08 K/UL (ref 0.00–0.10)
Eosinophils %: 17.4 % — ABNORMAL HIGH (ref 0.0–5.0)
Eosinophils Absolute: 1.29 K/UL — ABNORMAL HIGH (ref 0.00–0.40)
Hematocrit: 34.7 % — ABNORMAL LOW (ref 36.0–48.0)
Hemoglobin: 11.6 g/dL — ABNORMAL LOW (ref 13.0–16.0)
Immature Granulocytes %: 0.1 % (ref 0.0–0.5)
Immature Granulocytes Absolute: 0.01 K/UL (ref 0.00–0.04)
Lymphocytes %: 17.5 % — ABNORMAL LOW (ref 21.0–52.0)
Lymphocytes Absolute: 1.3 K/UL (ref 0.90–3.30)
MCH: 29.7 pg (ref 24.0–34.0)
MCHC: 33.4 g/dL (ref 31.0–37.0)
MCV: 88.7 FL (ref 78.0–100.0)
MPV: 11 FL (ref 9.2–11.8)
Monocytes %: 5.9 % (ref 3.0–10.0)
Monocytes Absolute: 0.44 K/UL (ref 0.05–1.20)
Neutrophils %: 58 % (ref 40.0–73.0)
Neutrophils Absolute: 4.29 K/UL (ref 1.80–8.00)
Nucleated RBCs: 0 /100{WBCs}
Platelets: 154 K/uL (ref 135–420)
RBC: 3.91 M/uL — ABNORMAL LOW (ref 4.35–5.65)
RDW: 14 % (ref 11.6–14.5)
WBC: 7.4 K/uL (ref 4.6–13.2)
nRBC: 0 K/uL (ref 0.00–0.01)

## 2024-02-02 LAB — MAGNESIUM: Magnesium: 2.3 mg/dL (ref 1.6–2.6)

## 2024-02-02 LAB — COVID-19 & INFLUENZA COMBO
Rapid Influenza A By PCR: NOT DETECTED
Rapid Influenza B By PCR: NOT DETECTED
SARS-CoV-2, PCR: NOT DETECTED

## 2024-02-02 LAB — LIPASE: Lipase: 61 U/L (ref 13–75)

## 2024-02-02 LAB — BRAIN NATRIURETIC PEPTIDE: NT Pro-BNP: 1931 pg/mL — ABNORMAL HIGH (ref 36–900)

## 2024-02-02 MED ORDER — ONDANSETRON HCL 4 MG/2ML IJ SOLN
4 | INTRAMUSCULAR | Status: AC
Start: 2024-02-02 — End: 2024-02-02
  Administered 2024-02-02: 10:00:00 4 mg via INTRAVENOUS

## 2024-02-02 MED ORDER — PROMETHAZINE HCL 25 MG PO TABS
25 | ORAL_TABLET | Freq: Four times a day (QID) | ORAL | 0 refills | Status: AC | PRN
Start: 2024-02-02 — End: 2024-02-09

## 2024-02-02 MED FILL — ONDANSETRON HCL 4 MG/2ML IJ SOLN: 4 MG/2ML | INTRAMUSCULAR | Qty: 2 | Fill #0

## 2024-02-02 NOTE — ED Notes (Signed)
 Patient discharged to home. All questions answered. Patient requesting transportation to home. Charge nurse made aware.

## 2024-02-02 NOTE — ED Triage Notes (Signed)
 Pt is HD patient, goes M, W, F and last went yesterday and was fully dialyzed. States has had chills, N/V without diarrhea since Monday.

## 2024-02-02 NOTE — Discharge Instructions (Signed)
 Thank you for choosing [facility] Emergency Department for your care. It is our privilege to provide excellent care for you in your time of need. In the next several days, you may receive a survey via mail or email about your experience with our team. We would appreciate you taking a few minutes to complete the survey, as we use this information to learn what we have done well and what we could be doing better. Thank you for trusting us  with your care.    -----------------------------------------------------------------------------  Below you will find a list of your tests from today's visit.   Labs  Recent Results (from the past 12 hours)   CBC with Auto Differential    Collection Time: 02/02/24  5:50 AM   Result Value Ref Range    WBC 7.4 4.6 - 13.2 K/uL    RBC 3.91 (L) 4.35 - 5.65 M/uL    Hemoglobin 11.6 (L) 13.0 - 16.0 g/dL    Hematocrit 65.2 (L) 36.0 - 48.0 %    MCV 88.7 78.0 - 100.0 FL    MCH 29.7 24.0 - 34.0 PG    MCHC 33.4 31.0 - 37.0 g/dL    RDW 85.9 88.3 - 85.4 %    Platelets 154 135 - 420 K/uL    MPV 11.0 9.2 - 11.8 FL    Nucleated RBCs 0.0 0 PER 100 WBC    nRBC 0.00 0.00 - 0.01 K/uL    Neutrophils % 58.0 40.0 - 73.0 %    Lymphocytes % 17.5 (L) 21.0 - 52.0 %    Monocytes % 5.9 3.0 - 10.0 %    Eosinophils % 17.4 (H) 0.0 - 5.0 %    Basophils % 1.1 0.0 - 2.0 %    Immature Granulocytes % 0.1 0.0 - 0.5 %    Neutrophils Absolute 4.29 1.80 - 8.00 K/UL    Lymphocytes Absolute 1.30 0.90 - 3.30 K/UL    Monocytes Absolute 0.44 0.05 - 1.20 K/UL    Eosinophils Absolute 1.29 (H) 0.00 - 0.40 K/UL    Basophils Absolute 0.08 0.00 - 0.10 K/UL    Immature Granulocytes Absolute 0.01 0.00 - 0.04 K/UL    Differential Type AUTOMATED     Comprehensive Metabolic Panel    Collection Time: 02/02/24  5:50 AM   Result Value Ref Range    Sodium 143 136 - 145 mmol/L    Potassium 3.5 3.5 - 5.5 mmol/L    Chloride 104 98 - 107 mmol/L    CO2 28 21 - 32 mmol/L    Anion Gap 12 3 - 18 mmol/L    Glucose 70 (L) 74 - 108  mg/dL    BUN 20 6 - 23 MG/DL    Creatinine 3.45 (H) 0.60 - 1.30 MG/DL    BUN/Creatinine Ratio 3 (L) 12 - 20      Est, Glom Filt Rate 10 (L) >60 ml/min/1.40m2    Calcium 9.0 8.5 - 10.1 MG/DL    Total Bilirubin 0.6 0.2 - 1.0 MG/DL    ALT 18 10 - 50 U/L    AST 23 10 - 38 U/L    Alk Phosphatase 70 45 - 117 U/L    Total Protein 8.0 6.4 - 8.2 g/dL    Albumin 3.1 (L) 3.4 - 5.0 g/dL    Globulin 4.9 (H) 2.0 - 4.0 g/dL    Albumin/Globulin Ratio 0.6 (L) 0.8 - 1.7     Magnesium    Collection Time: 02/02/24  5:50 AM  Result Value Ref Range    Magnesium 2.3 1.6 - 2.6 mg/dL   Brain Natriuretic Peptide    Collection Time: 02/02/24  5:50 AM   Result Value Ref Range    NT Pro-BNP 1,931 (H) 36 - 900 PG/ML   Lipase    Collection Time: 02/02/24  5:50 AM   Result Value Ref Range    Lipase 61 13 - 75 U/L   COVID-19 & Influenza Combo    Collection Time: 02/02/24  6:15 AM    Specimen: Nasopharynx   Result Value Ref Range    Source Nasopharyngeal      SARS-CoV-2, PCR Not detected NOTD      Rapid Influenza A By PCR Not detected NOTD      Rapid Influenza B By PCR Not detected NOTD          Radiologic Studies  XR CHEST PORTABLE   Final Result      No acute process.         Electronically signed by Toribio PARAS Musick        -----------------------------------------------------------------------------  The evaluation and treatment you received in the Emergency Department were for an urgent problem. It is important that you follow-up with a doctor, nurse practitioner, or physician assistant to: 1. confirm your diagnosis, 2. re-evaluate changes in your illness and treatment, and 3. for ongoing care. In some cases, specialist follow up is recommended. You will need to call to arrange an appointment. Recommended providers are listed in this packet. Please take your discharge instructions with you when you go to your follow-up appointment.      If your symptoms become worse or you do not improve as expected, please return to us . We are available 24  hours a day.      If a prescription has been provided, please fill it as soon as possible to prevent a delay in treatment. If you have any questions or reservations about taking the medication due to side effects or interactions with other medications, please call your primary care provider or contact us  directly.  Again, THANK YOU for choosing us  to care for YOU!

## 2024-02-02 NOTE — ED Provider Notes (Signed)
 [facility] EMERGENCY DEPT  EMERGENCY DEPARTMENT ENCOUNTER    Patient Name: Cole White  MRN: [id number]  Birth Date: May 07, 1976  Provider: Selinda Fireman, MD  PCP: Unknown, Provider, NP-C   Nephrology: Dr. Leontine  Time/Date of evaluation: 5:40 AM EDT on 02/02/24    History of Presenting Illness     Chief Complaint   Patient presents with    Nausea    Vomiting       History Provided by: Patient and Patient's Husband   History is limited by: Nothing    HISTORY Cole):   Cole White is a 48 y.o. male with a PMHX of ESRD (HD MWF) who presents to the emergency department (room 4) by POV C/O nausea and vomiting onset Monday (5 days ago). Associated sxs include chills. Patient denies diarrhea or any other sxs or complaints.  Patient states last dialysis was Friday (yesterday).  Patient states that he normally gets sick at dialysis and is undergoing cardiology evaluation for this.  He did have a full run on Friday however.    Nursing Notes were all reviewed and agreed with or any disagreements were addressed in the HPI.    Past History     PAST MEDICAL HISTORY:  No past medical history on file.    PAST SURGICAL HISTORY:  No past surgical history on file.    FAMILY HISTORY:  No family history on file.    SOCIAL HISTORY:       MEDICATIONS:  No current facility-administered medications for this encounter.     Current Outpatient Medications   Medication Sig Dispense Refill    promethazine  (PHENERGAN ) 25 MG tablet Take 1 tablet by mouth every 6 hours as needed for Nausea 12 tablet 0    ibuprofen  (ADVIL ;MOTRIN ) 800 MG tablet Take 1 tablet by mouth every 8 hours as needed for Pain 30 tablet 0       ALLERGIES:  No Known Allergies    SOCIAL DETERMINANTS OF HEALTH:  Social Drivers of Health     Tobacco Use: Medium Risk (12/31/2023)    Received from [facility]    Patient History     Smoking Tobacco Use: Former     Smokeless Tobacco Use: Never     Passive Exposure: Current   Alcohol Use: Not At Risk (09/18/2022)    Received from  [facility]    AUDIT-C     Q1: How often do you have a drink containing alcohol?: Never     Q2: How many drinks containing alcohol do you have on a typical day when you are drinking?: Patient does not drink     Q3: How often do you have six or more drinks on one occasion?: Never   Financial Resource Strain: Medium Risk (09/18/2022)    Received from [facility]    Overall Financial Resource Strain (CARDIA)     Difficulty of Paying Living Expenses: Somewhat hard   Food Insecurity: No Food Insecurity (03/05/2023)    Received from [facility]    Hunger Vital Sign     Within the past 12 months, you worried that your food would run out before you got the money to buy more.: Never true     Within the past 12 months, the food you bought just didn't last and you didn't have money to get more.: Never true   Transportation Needs: No Transportation Needs (03/05/2023)    Received from [facility] - Transportation  Lack of Transportation (Medical): No     Lack of Transportation (Non-Medical): No   Physical Activity: Inactive (09/18/2022)    Received from [facility]    Exercise Vital Sign     On average, how many days per week do you engage in moderate to strenuous exercise (like a brisk walk)?: 0 days     On average, how many minutes do you engage in exercise at this level?: 0 min   Stress: No Stress Concern Present (09/18/2022)    Received from [facility] - Occupational Stress Questionnaire     Feeling of Stress : Not at all   Social Connections: Socially Isolated (09/18/2022)    Received from [facility]    Social Connection and Isolation Panel     In a typical week, how many times do you talk on the phone with family, friends, or neighbors?: More than three times a week     How often do you get together with friends or relatives?: More than three times a week     How often do you attend church or religious services?: Never     Do you belong to any clubs  or organizations such as church groups, unions, fraternal or athletic groups, or school groups?: No     How often do you attend meetings of the clubs or organizations you belong to?: Never     Are you married, widowed, divorced, separated, never married, or living with a partner?: Separated   Intimate Partner Violence: Unknown (03/05/2023)    Received from [facility]    Humiliation, Afraid, Rape, and Kick questionnaire     Within the last year, have you been afraid of your partner or ex-partner?: No     Within the last year, have you been humiliated or emotionally abused in other ways by your partner or ex-partner?: No     Within the last year, have you been kicked, hit, slapped, or otherwise physically hurt by your partner or ex-partner?: No     Sexually Abused: Not on file   Depression: Not at risk (10/01/2023)    Received from [facility]    PHQ-2     PHQ-2 Total Score: 0   Housing Stability: Low Risk  (03/05/2023)    Received from [facility] Stability Vital Sign     In the last 12 months, was there a time when you were not able to pay the mortgage or rent on time?: No     In the past 12 months, how many times have you moved where you were living?: 0     At any time in the past 12 months, were you homeless or living in a shelter (including now)?: No   Interpersonal Safety: Unknown (03/05/2023)    Received from [facility]    Humiliation, Afraid, Rape, and Kick questionnaire     Within the last year, have you been afraid of your partner or ex-partner?: No     Within the last year, have you been humiliated or emotionally abused in other ways by your partner or ex-partner?: No     Within the last year, have you been kicked, hit, slapped, or otherwise physically hurt by your partner or ex-partner?: No     Sexually Abused: Not on file   Utilities: Not At Risk (03/05/2023)    Received from [facility]     Threatened with  loss of utilities: No       Review of Systems     Negative  except as listed above in HPI.    Physical Exam     Vitals:    02/02/24 0600 02/02/24 0615 02/02/24 0630 02/02/24 0645   BP: (!) 198/99  (!) 179/75 (!) 185/86   Pulse: 71 79 74 77   Resp: 12 28 12 16    Temp:       TempSrc:       SpO2: 99% 96% 98% 100%   Weight:       Height:           Physical Exam  Vitals and nursing note reviewed.   Constitutional:       General: He is not in acute distress.     Appearance: Normal appearance. He is normal weight. He is not ill-appearing.   HENT:      Head: Normocephalic and atraumatic.      Nose: Nose normal. No rhinorrhea.      Mouth/Throat:      Mouth: Mucous membranes are moist.      Pharynx: No oropharyngeal exudate or posterior oropharyngeal erythema.   Eyes:      General:         Right eye: No discharge.         Left eye: No discharge.      Extraocular Movements: Extraocular movements intact.      Conjunctiva/sclera: Conjunctivae normal.      Pupils: Pupils are equal, round, and reactive to light.   Cardiovascular:      Rate and Rhythm: Normal rate and regular rhythm.      Heart sounds: No murmur heard.     No friction rub. No gallop.   Pulmonary:      Effort: Pulmonary effort is normal. No respiratory distress.      Breath sounds: No wheezing, rhonchi or rales.   Abdominal:      General: Bowel sounds are normal.      Palpations: Abdomen is soft.      Tenderness: There is no abdominal tenderness. There is no guarding or rebound.   Musculoskeletal:         General: No swelling, tenderness or deformity. Normal range of motion.      Cervical back: Normal range of motion and neck supple. No rigidity.   Lymphadenopathy:      Cervical: No cervical adenopathy.   Skin:     General: Skin is warm and dry.      Findings: No rash.        Neurological:      General: No focal deficit present.      Mental Status: He is alert and oriented to person, place, and time.   Psychiatric:         Mood and Affect: Mood normal.         Behavior: Behavior normal.         Diagnostic Study Results      LABS:  Recent Results (from the past 12 hours)   CBC with Auto Differential    Collection Time: 02/02/24  5:50 AM   Result Value Ref Range    WBC 7.4 4.6 - 13.2 K/uL    RBC 3.91 (L) 4.35 - 5.65 M/uL    Hemoglobin 11.6 (L) 13.0 - 16.0 g/dL    Hematocrit 65.2 (L) 36.0 - 48.0 %    MCV 88.7 78.0 - 100.0 FL    MCH 29.7 24.0 -  34.0 PG    MCHC 33.4 31.0 - 37.0 g/dL    RDW 85.9 88.3 - 85.4 %    Platelets 154 135 - 420 K/uL    MPV 11.0 9.2 - 11.8 FL    Nucleated RBCs 0.0 0 PER 100 WBC    nRBC 0.00 0.00 - 0.01 K/uL    Neutrophils % 58.0 40.0 - 73.0 %    Lymphocytes % 17.5 (L) 21.0 - 52.0 %    Monocytes % 5.9 3.0 - 10.0 %    Eosinophils % 17.4 (H) 0.0 - 5.0 %    Basophils % 1.1 0.0 - 2.0 %    Immature Granulocytes % 0.1 0.0 - 0.5 %    Neutrophils Absolute 4.29 1.80 - 8.00 K/UL    Lymphocytes Absolute 1.30 0.90 - 3.30 K/UL    Monocytes Absolute 0.44 0.05 - 1.20 K/UL    Eosinophils Absolute 1.29 (H) 0.00 - 0.40 K/UL    Basophils Absolute 0.08 0.00 - 0.10 K/UL    Immature Granulocytes Absolute 0.01 0.00 - 0.04 K/UL    Differential Type AUTOMATED     Comprehensive Metabolic Panel    Collection Time: 02/02/24  5:50 AM   Result Value Ref Range    Sodium 143 136 - 145 mmol/L    Potassium 3.5 3.5 - 5.5 mmol/L    Chloride 104 98 - 107 mmol/L    CO2 28 21 - 32 mmol/L    Anion Gap 12 3 - 18 mmol/L    Glucose 70 (L) 74 - 108 mg/dL    BUN 20 6 - 23 MG/DL    Creatinine 3.45 (H) 0.60 - 1.30 MG/DL    BUN/Creatinine Ratio 3 (L) 12 - 20      Est, Glom Filt Rate 10 (L) >60 ml/min/1.72m2    Calcium 9.0 8.5 - 10.1 MG/DL    Total Bilirubin 0.6 0.2 - 1.0 MG/DL    ALT 18 10 - 50 U/L    AST 23 10 - 38 U/L    Alk Phosphatase 70 45 - 117 U/L    Total Protein 8.0 6.4 - 8.2 g/dL    Albumin 3.1 (L) 3.4 - 5.0 g/dL    Globulin 4.9 (H) 2.0 - 4.0 g/dL    Albumin/Globulin Ratio 0.6 (L) 0.8 - 1.7     Magnesium    Collection Time: 02/02/24  5:50 AM   Result Value Ref Range    Magnesium 2.3 1.6 - 2.6 mg/dL   Brain Natriuretic Peptide    Collection Time:  02/02/24  5:50 AM   Result Value Ref Range    NT Pro-BNP 1,931 (H) 36 - 900 PG/ML   Lipase    Collection Time: 02/02/24  5:50 AM   Result Value Ref Range    Lipase 61 13 - 75 U/L   COVID-19 & Influenza Combo    Collection Time: 02/02/24  6:15 AM    Specimen: Nasopharynx   Result Value Ref Range    Source Nasopharyngeal      SARS-CoV-2, PCR Not detected NOTD      Rapid Influenza A By PCR Not detected NOTD      Rapid Influenza B By PCR Not detected NOTD         RADIOLOGIC STUDIES:   Non x-ray images such as CT, Ultrasound and MRI are read by the radiologist. X-ray images are visualized and preliminarily interpreted by the ED Provider with the findings as listed in the ED Course section below.  Interpretation per the Radiologist is listed below, if available at the time of this note:    XR CHEST PORTABLE   Final Result      No acute process.         Electronically signed by Toribio PARAS Musick          Procedures     Procedures    ED Course     5:40 AM EDT I Hezzie Fireman, MD) am the first provider for this patient. Initial assessment performed. I reviewed the vital signs, available nursing notes, past medical history, past surgical history, family history and social history. The patients presenting problems have been discussed, and they are in agreement with the care plan formulated and outlined with them.  I have encouraged them to ask questions as they arise throughout their visit.    RECORDS REVIEWED: Nursing Notes    MEDICATIONS ADMINISTERED IN THE ED:  Medications   ondansetron  (ZOFRAN ) injection 4 mg (4 mg IntraVENous Given 02/02/24 0606)       ED Course as of 02/02/24 0721   Sat Feb 02, 2024   0613 EKG interpretation:  Rhythm: NSR. Rate: 71 bpm; No STEMI  EKG read by Selinda Fireman, MD [JM]   [phone] RADIOLOGY FINDINGS  Chest X-ray shows no acute cardiopulmonary process.  Interpreted by Selinda Fireman, MD.  Confirmed by Radiologist [JM]      ED Course User Index  [JM] Avari Nevares, Selinda LABOR, MD       None    Medical  Decision Making     SCREENING TOOLS:  None    CLINICAL MANAGEMENT TOOLS:  Not Applicable    Is this patient to be included in the SEP-1 core measure? No Exclusion criteria - the patient is NOT to be included for SEP-1 Core Measure due to: 2+ SIRS criteria are not met    DDX: COVID, influenza, pulmonary edema, electrolyte disorder, less likely, pancreatitis, appendicitis, cholecystitis    DISCUSSION:  This appears to be a moderate condition.  This appears to be an acute condition.    48 y.o. male with nausea, vomiting, chills.  In evaluation of the above differential diagnosis, consideration was given to the following tests and treatments.  CBC at baseline.  CMP with findings of chronic dialysis however potassium not elevated.  Magnesium within normal limits.  Lipase unremarkable.  proBNP elevated consistent with dialysis status, however chest x-ray does not show any pulmonary edema.  No findings to indicate the need for emergent dialysis today.  Flu and COVID were negative.  Patient has Zofran  at home but is not helping with his nausea and vomiting so we will switch to Phenergan .  Patient follow-up with his nephrologist and primary care. The decision to perform testing and results were discussed with the patient.  I discussed each of these tests and considerations with the patient. They agree with the plan of discharge.      ADDITIONAL CONSIDERATIONS:  None    SMOKING CESSATION:   None    Critical Care Time:     None    Selinda Fireman, MD    Diagnosis and Disposition     DISPOSITION Decision To Discharge 02/02/2024 07:23:27 AM   DISPOSITION CONDITION Stable     DISCHARGE NOTE:  Parsa Rickett Schwager's  results have been reviewed with him.  He has been counseled regarding his diagnosis, treatment, and plan.  He verbally conveys understanding and agreement of the signs, symptoms, diagnosis, treatment and prognosis and additionally agrees to  follow up as discussed.  He also agrees with the care-plan and conveys that all of  his questions have been answered.  I have also provided discharge instructions for him that include: educational information regarding their diagnosis and treatment, and list of reasons why they would want to return to the ED prior to their follow-up appointment, should his condition change. He has been provided with education for proper emergency department utilization.     CLINICAL IMPRESSION:  1. Nausea and vomiting, unspecified vomiting type        PLAN:  D/C Home    DISCHARGE MEDICATIONS:  Current Discharge Medication List             Details   promethazine  (PHENERGAN ) 25 MG tablet Take 1 tablet by mouth every 6 hours as needed for Nausea  Qty: 12 tablet, Refills: 0                Details   ibuprofen  (ADVIL ;MOTRIN ) 800 MG tablet Take 1 tablet by mouth every 8 hours as needed for Pain  Qty: 30 tablet, Refills: 0             DISCONTINUED MEDICATIONS:  Current Discharge Medication List          PATIENT REFERRED TO:  Follow Up with:  Leontine Rogelio SAILOR, MD  [address] [place] [address]  [phone]    Schedule an appointment as soon as possible for a visit   As soon as possible, For follow up from the Emergency Department    [facility] Emergency Department  [address]  [place]  [phone]  [phone]    As needed, If symptoms worsen      I Selinda Fireman, MD am the primary clinician of record.    Dragon Disclaimer     Please note that this dictation was completed with Dragon, the [employer] account planner.  Quite often unanticipated grammatical, syntax, homophones, and other interpretive errors are inadvertently transcribed by the computer software.  Please disregard these errors.  Please excuse any errors that have escaped final proofreading.    Selinda Fireman, MD  (Electronically signed)            Fireman Selinda LABOR, MD  02/02/24 [phone]

## 2024-02-04 LAB — EKG 12-LEAD
Atrial Rate: 71 {beats}/min
Diagnosis: NORMAL
P Axis: 75 degrees
P-R Interval: 198 ms
Q-T Interval: 434 ms
QRS Duration: 104 ms
QTc Calculation (Bazett): 471 ms
R Axis: -12 degrees
T Axis: 100 degrees
Ventricular Rate: 71 {beats}/min
